# Patient Record
Sex: Female | Born: 1980 | Race: Black or African American | Hispanic: No | Marital: Single | Smoking: Never smoker
Health system: Southern US, Community
[De-identification: ages and names within clinical notes are randomized; demographics above are authoritative.]

## PROBLEM LIST (undated history)

## (undated) ENCOUNTER — Emergency Department (HOSPITAL_COMMUNITY): Disposition: A | Discharge status: Home / Self Care | Payer: Self-pay

## (undated) DIAGNOSIS — N912 Amenorrhea, unspecified: Secondary | ICD-10-CM

## (undated) DIAGNOSIS — D509 Iron deficiency anemia, unspecified: Secondary | ICD-10-CM

## (undated) DIAGNOSIS — H538 Other visual disturbances: Secondary | ICD-10-CM

## (undated) HISTORY — DX: Amenorrhea, unspecified: N91.2

## (undated) HISTORY — DX: Other visual disturbances: H53.8

---
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
## 2004-10-17 ENCOUNTER — Emergency Department (HOSPITAL_COMMUNITY): Admission: EM | Admit: 2004-10-17 | Discharge: 2004-10-17 | Payer: Self-pay | Admitting: Family Medicine

## 2004-10-25 ENCOUNTER — Emergency Department (HOSPITAL_COMMUNITY): Admission: EM | Admit: 2004-10-25 | Discharge: 2004-10-25 | Payer: Self-pay | Admitting: Family Medicine

## 2005-10-18 ENCOUNTER — Emergency Department (HOSPITAL_COMMUNITY): Admission: EM | Admit: 2005-10-18 | Discharge: 2005-10-18 | Payer: Self-pay | Admitting: Family Medicine

## 2006-03-10 ENCOUNTER — Encounter (INDEPENDENT_AMBULATORY_CARE_PROVIDER_SITE_OTHER): Payer: Self-pay | Admitting: *Deleted

## 2006-03-12 ENCOUNTER — Ambulatory Visit: Payer: Self-pay

## 2006-03-26 ENCOUNTER — Emergency Department (HOSPITAL_COMMUNITY): Admission: EM | Admit: 2006-03-26 | Discharge: 2006-03-26 | Payer: Self-pay | Admitting: Family Medicine

## 2006-04-06 ENCOUNTER — Other Ambulatory Visit: Admission: RE | Admit: 2006-04-06 | Discharge: 2006-04-06 | Payer: Self-pay | Admitting: Family Medicine

## 2006-04-06 ENCOUNTER — Ambulatory Visit: Payer: Self-pay | Admitting: Family Medicine

## 2006-06-07 DIAGNOSIS — D509 Iron deficiency anemia, unspecified: Secondary | ICD-10-CM

## 2006-06-07 DIAGNOSIS — F17200 Nicotine dependence, unspecified, uncomplicated: Secondary | ICD-10-CM

## 2006-06-07 HISTORY — DX: Iron deficiency anemia, unspecified: D50.9

## 2006-06-08 ENCOUNTER — Encounter (INDEPENDENT_AMBULATORY_CARE_PROVIDER_SITE_OTHER): Payer: Self-pay | Admitting: *Deleted

## 2007-02-19 ENCOUNTER — Emergency Department (HOSPITAL_COMMUNITY): Admission: EM | Admit: 2007-02-19 | Discharge: 2007-02-19 | Payer: Self-pay | Admitting: Emergency Medicine

## 2007-10-29 ENCOUNTER — Emergency Department (HOSPITAL_COMMUNITY): Admission: EM | Admit: 2007-10-29 | Discharge: 2007-10-29 | Payer: Self-pay | Admitting: Emergency Medicine

## 2007-12-05 ENCOUNTER — Ambulatory Visit: Payer: Self-pay | Admitting: Family Medicine

## 2007-12-24 ENCOUNTER — Encounter: Payer: Self-pay | Admitting: Family Medicine

## 2007-12-25 ENCOUNTER — Telehealth: Payer: Self-pay | Admitting: Family Medicine

## 2007-12-27 ENCOUNTER — Encounter: Payer: Self-pay | Admitting: Family Medicine

## 2007-12-27 ENCOUNTER — Ambulatory Visit: Payer: Self-pay | Admitting: Family Medicine

## 2007-12-27 LAB — CONVERTED CEMR LAB
HDL: 55 mg/dL (ref >39)
LDL Cholesterol: 69 mg/dL (ref 0–99)
TSH: 0.639 microintl units/mL (ref 0.350–4.50)
VLDL: 12 mg/dL (ref 0–40)

## 2008-01-02 ENCOUNTER — Encounter: Payer: Self-pay | Admitting: Family Medicine

## 2008-01-21 ENCOUNTER — Telehealth: Payer: Self-pay | Admitting: Family Medicine

## 2008-05-25 ENCOUNTER — Emergency Department (HOSPITAL_COMMUNITY): Admission: EM | Admit: 2008-05-25 | Discharge: 2008-05-25 | Payer: Self-pay | Admitting: Family Medicine

## 2008-06-01 ENCOUNTER — Encounter: Payer: Self-pay | Admitting: Family Medicine

## 2008-06-01 ENCOUNTER — Ambulatory Visit: Payer: Self-pay | Admitting: Family Medicine

## 2008-06-01 DIAGNOSIS — F1910 Other psychoactive substance abuse, uncomplicated: Secondary | ICD-10-CM

## 2008-06-01 LAB — CONVERTED CEMR LAB
Hemoglobin: 13.2 g/dL (ref 12.0–15.0)
RDW: 13.8 % (ref 11.5–15.5)
TSH: 0.862 microintl units/mL (ref 0.350–4.50)

## 2008-06-03 ENCOUNTER — Encounter: Payer: Self-pay | Admitting: Family Medicine

## 2008-06-11 ENCOUNTER — Ambulatory Visit: Payer: Self-pay | Admitting: Family Medicine

## 2008-06-11 DIAGNOSIS — F341 Dysthymic disorder: Secondary | ICD-10-CM | POA: Insufficient documentation

## 2009-04-10 ENCOUNTER — Emergency Department (HOSPITAL_COMMUNITY): Admission: EM | Admit: 2009-04-10 | Discharge: 2009-04-10 | Payer: Self-pay | Admitting: Family Medicine

## 2010-03-18 ENCOUNTER — Ambulatory Visit: Payer: Self-pay | Admitting: Family Medicine

## 2010-03-18 ENCOUNTER — Emergency Department (HOSPITAL_COMMUNITY)
Admission: EM | Admit: 2010-03-18 | Discharge: 2010-03-18 | Payer: Self-pay | Source: Home / Self Care | Admitting: Family Medicine

## 2010-04-20 ENCOUNTER — Other Ambulatory Visit
Admission: RE | Admit: 2010-04-20 | Discharge: 2010-04-20 | Payer: Self-pay | Source: Home / Self Care | Admitting: Family Medicine

## 2010-04-20 ENCOUNTER — Ambulatory Visit: Admission: RE | Admit: 2010-04-20 | Discharge: 2010-04-20 | Payer: Self-pay | Source: Home / Self Care

## 2010-04-20 ENCOUNTER — Encounter: Payer: Self-pay | Admitting: Family Medicine

## 2010-04-20 LAB — CONVERTED CEMR LAB: Whiff Test: POSITIVE

## 2010-04-27 ENCOUNTER — Ambulatory Visit: Admission: RE | Admit: 2010-04-27 | Discharge: 2010-04-27 | Payer: Self-pay | Source: Home / Self Care

## 2010-04-27 ENCOUNTER — Encounter: Payer: Self-pay | Admitting: Family Medicine

## 2010-04-27 LAB — CONVERTED CEMR LAB: Beta hcg, urine, semiquantitative: NEGATIVE

## 2010-05-10 NOTE — Assessment & Plan Note (Signed)
Summary: ear infection per pt/eo   At 10:50 AM received a call from Urgent Care stating patient is there to be seen.  she states to them that she had waited in our office an hour and she has headache and earache . it is noted that her scheduled appointment with Dr. Clotilde Dieter was 10:15 AM. patient checked in at 10:23. consulted Dr. Leveda Anna and he advises that patient may stay at urgent care to be seen. Theresia Lo RN  March 18, 2010 11:20 AM    arrived at [facility] , no charge. Theresia Lo RN  March 18, 2010 11:20 AM    Complete Medication List: 1)  Antivert 25 Mg Tabs (Meclizine hcl) .Marland Kitchen.. 1 tab by mouth two times a day as needed diziness 2)  Zoloft 25 Mg Tabs (Sertraline hcl) .... Take 1 25mg  tablet by mouth each morning for 1 week, then increase it to two 47m tablets each morning.  Other Orders: No Charge Patient Arrived (NCPA0) (NCPA0)   Orders Added: 1)  No Charge Patient Arrived (NCPA0) [NCPA0]

## 2010-05-11 ENCOUNTER — Encounter: Payer: Self-pay | Admitting: *Deleted

## 2010-05-12 ENCOUNTER — Encounter: Payer: Self-pay | Admitting: Family Medicine

## 2010-05-12 ENCOUNTER — Ambulatory Visit (INDEPENDENT_AMBULATORY_CARE_PROVIDER_SITE_OTHER): Payer: Medicaid Other | Admitting: Family Medicine

## 2010-05-12 DIAGNOSIS — N760 Acute vaginitis: Secondary | ICD-10-CM

## 2010-05-12 DIAGNOSIS — A5901 Trichomonal vulvovaginitis: Secondary | ICD-10-CM

## 2010-05-12 DIAGNOSIS — R109 Unspecified abdominal pain: Secondary | ICD-10-CM

## 2010-05-12 NOTE — Miscellaneous (Signed)
Summary: IUD insertion & removal  IUD insertion & removal   Imported By: Knox Royalty 05/05/2010 11:54:20  _____________________________________________________________________  External Attachment:    Type:   Image     Comment:   External Document

## 2010-05-12 NOTE — Assessment & Plan Note (Signed)
Summary: IUD insertion   Vital Signs:  Patient profile:   30 year old female Height:      62 inches Weight:      109.2 pounds BMI:     20.05 Temp:     98.3 degrees F oral Pulse rate:   69 / minute BP sitting:   143 / 96  (right arm) Cuff size:   regular  Vitals Entered By: Jimmy Footman, CMA (April 27, 2010 9:45 AM) CC: IUD insertion Is Patient Diabetic? No Pain Assessment Patient in pain? no        Primary Care Provider:  Jamie Brookes MD  CC:  IUD insertion.  History of Present Illness: Pt comes in to get her IUD changed out.  Upreg checked: neg, prior to insertion.   Risks and Benefits explained to the patient. Pt signed consent form.  Pt prepped with betadine swabs x 3 Pt sounded to 5-6 cm.  IUD placed with help of Dr. Jennette Kettle. Strings cut and left a little bit long. Pt to return in 2 weeks to have the strings checked and cut shorter.    Habits & Providers  Alcohol-Tobacco-Diet     Tobacco Status: current  Current Medications (verified): 1)  Metronidazole 500 Mg Tabs (Metronidazole) .... Take 4 Tables All At The Same Time, All At Once. Don't Drink Alcohol With This Med. Your Parter(S) Need Treatment or You Will Re-Infect  Allergies (verified): No Known Drug Allergies  Physical Exam  General:  Well-developed,well-nourished,in no acute distress; alert,appropriate and cooperative throughout examination   Impression & Recommendations:  Problem # 1:  CONTRACEPTIVE MANAGEMENT (ICD-V25.09) Assessment New Pt tolerated the procedure well, she had some pain with insertion, pt was given Toradol shot prior to leaving. She will come back in 2 weeks to have the strings checked and shortened. Can recheck for BV at that time.  Orders: U Preg-[facility] (16109) IUD insert- [facility] [id number]) IUD Supply-[facility] (U9811) Ketorolac-Toradol 15mg  (B1478) IUD removal -[facility] (29562)  Complete Medication List: 1)  Metronidazole 500 Mg Tabs (Metronidazole) .... Take 4 tables all at the same  time, all at once. don't drink alcohol with this med. your parter(s) need treatment or you will re-infect  Patient Instructions: 1)  come back in 2 weeks for a strings check and to get retested.    Medication Administration  Injection # 1:    Medication: Ketorolac-Toradol 15mg     Diagnosis: CONTRACEPTIVE MANAGEMENT (ICD-V25.09)    Route: IM    Site: RUOQ gluteus    Exp Date: 09/09/2011    Lot #: [id number]    Mfr: novaplus    Comments: 60mg     Patient tolerated injection without complications    Given by: Jimmy Footman, CMA (April 27, 2010 4:43 PM)  Orders Added: 1)  U Preg-[facility] [81025] 2)  IUD insert- [facility] [58300] 3)  IUD Supply-[facility] [J7302] 4)  Ketorolac-Toradol 15mg  [J1885] 5)  [facility]UD removal -[facility] [58301]    Laboratory Results   Urine Tests  Date/Time Received: April 27, 2010 9:50 AM  Date/Time Reported: April 27, 2010 9:59 AM     Urine HCG: negative Comments: ...............test performed by......Marland KitchenBonnie A. [place], MLS (ASCP)cm
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
## 2010-05-13 ENCOUNTER — Ambulatory Visit: Payer: Self-pay | Admitting: Family Medicine

## 2010-05-13 ENCOUNTER — Telehealth: Payer: Self-pay | Admitting: *Deleted

## 2010-05-13 ENCOUNTER — Ambulatory Visit: Admit: 2010-05-13 | Payer: Self-pay

## 2010-05-18 NOTE — Progress Notes (Signed)
  Phone Note Outgoing Call   Call placed by: Loralee Pacas CMA,  May 13, 2010 9:20 AM Call placed to: Patient Summary of Call: informed pt of results

## 2010-05-18 NOTE — Assessment & Plan Note (Signed)
Summary: abd pain,df   Vital Signs:  Patient profile:   30 year old female Height:      62 inches Weight:      108 pounds BMI:     19.82 Temp:     98.1 degrees F oral Pulse rate:   72 / minute BP sitting:   123 / 85  (left arm) Cuff size:   regular  Vitals Entered By: Tessie Fass CMA (May 12, 2010 10:24 AM) CC: abdominal pain. Pain Assessment Patient in pain? yes     Location: abdomen Intensity: 7   Primary Care Provider:  Jamie Brookes MD  CC:  abdominal pain.Marland Kitchen  History of Present Illness: Crampy abd pain for the last 1 week. She had an IUD placed about 2 weeks ago and is here to get her strings checked at cut. She has cramping pain that lasts minutes to hours and then will go away. She has tried Tylenol with little help, and Motrin which helped better. She says the pain lasted the first 2 days, then went away for about a week and then came back this last 1 week. She is having some constipation and has not stooled in the last 3 days.   Trichomonas: Pt finished her course of Abx and want to know if you infection has cleared.   Current Medications (verified): 1)  Ibuprofen 800 Mg Tabs (Ibuprofen) .... Take 1 Tab Every 8 Hours For Abdominal Cramping  Allergies (verified): No Known Drug Allergies  Review of Systems        vitals reviewed and pertinent negatives and positives seen in HPI   Physical Exam  General:  Well-developed,well-nourished,in no acute distress; alert,appropriate and cooperative throughout examination Abdomen:  Bowel sounds positive,abdomen soft and non-tender without masses, organomegaly but pt has small umbilical hernia.  Genitalia:  Normal introitus for age, no external lesions, no vaginal discharge, mucosa pink and moist, no vaginal or cervical lesions, no vaginal atrophy, no friaility or hemorrhage, normal uterus size and position, no adnexal masses or tenderness, IUD strings noted in vagina Psych:  slightly anxious.     Impression &  Recommendations:  Problem # 1:  ABDOMINAL PAIN, LOWER (ICD-789.09) Assessment New Pt has some cramping likely cause of the IUD. It comes and goes. No pain today. Plan to treat with Ibuprofen as needed. IUD strings shortened today.   Her updated medication list for this problem includes:    Ibuprofen 800 Mg Tabs (Ibuprofen) .Marland Kitchen... Take 1 tab every 8 hours for abdominal cramping  Orders: [facility]- Est Level  3 (56213)  Problem # 2:  TRICHOMONAL VAGINITIS (ICD-131.01) Assessment: Unchanged Will repeat Wet prep to do test of cure. Pt took entire treatment of Flagyl. Will call the patient with results.   Orders: [facility]- Est Level  3 (08657)  Complete Medication List: 1)  Ibuprofen 800 Mg Tabs (Ibuprofen) .... Take 1 tab every 8 hours for abdominal cramping  [facility]  Patient Instructions: 1)  every thing feels normal. 2)  We shortened your strings today.  3)  You can use Motrin 800 mg for your abdominal pain. 4)  My office will call you with your wet prep results.  Prescriptions: IBUPROFEN 800 MG TABS (IBUPROFEN) take 1 tab every 8 hours for abdominal cramping  #60 x 0   Entered and Authorized by:   Jamie Brookes MD   Signed by:   Jamie Brookes MD on 05/12/2010   Method used:   Electronically to  [facility]  [address] [id number]* (retail)       [address]       [place], [place]  [address]       Ph: [phone]       Fax: [phone]   RxID:   [id number]    Orders Added: 1)  Wet Prep- [facility] [87210] 2)  [facility]- Est Level  3 [62694]    Laboratory Results  Date/Time Received: May 12, 2010 10:52 AM  Date/Time Reported: May 12, 2010 11:40 AM   [employer] Source: vag WBC/hpf: loaded Bacteria/hpf: 3+  Rods Clue cells/hpf: none  Negative whiff Yeast/hpf: few Trichomonas/hpf: none Comments: .baj

## 2010-09-12 ENCOUNTER — Inpatient Hospital Stay (INDEPENDENT_AMBULATORY_CARE_PROVIDER_SITE_OTHER)
Admission: RE | Admit: 2010-09-12 | Discharge: 2010-09-12 | Disposition: A | Discharge status: Home / Self Care | Payer: Medicaid Other | Source: Ambulatory Visit | Attending: Emergency Medicine | Admitting: Emergency Medicine

## 2010-09-12 ENCOUNTER — Emergency Department (HOSPITAL_COMMUNITY)
Admission: EM | Admit: 2010-09-12 | Discharge: 2010-09-12 | Disposition: A | Discharge status: Home / Self Care | Payer: Medicaid Other | Attending: Emergency Medicine | Admitting: Emergency Medicine

## 2010-09-12 ENCOUNTER — Emergency Department (HOSPITAL_COMMUNITY): Payer: Medicaid Other

## 2010-09-12 DIAGNOSIS — J029 Acute pharyngitis, unspecified: Secondary | ICD-10-CM

## 2010-09-12 DIAGNOSIS — J039 Acute tonsillitis, unspecified: Secondary | ICD-10-CM | POA: Insufficient documentation

## 2010-09-12 DIAGNOSIS — F17200 Nicotine dependence, unspecified, uncomplicated: Secondary | ICD-10-CM | POA: Insufficient documentation

## 2010-09-12 DIAGNOSIS — J351 Hypertrophy of tonsils: Secondary | ICD-10-CM

## 2010-09-12 LAB — CBC
HCT: 35.9 % — ABNORMAL LOW (ref 36.0–46.0)
Hemoglobin: 12.9 g/dL (ref 12.0–15.0)
MCH: 34.6 pg — ABNORMAL HIGH (ref 26.0–34.0)
MCV: 96.2 fL (ref 78.0–100.0)
Platelets: 227 10*3/uL (ref 150–400)
RBC: 3.73 MIL/uL — ABNORMAL LOW (ref 3.87–5.11)
WBC: 16.8 10*3/uL — ABNORMAL HIGH (ref 4.0–10.5)

## 2010-09-12 LAB — POCT I-STAT, CHEM 8
BUN: 5 mg/dL — ABNORMAL LOW (ref 6–23)
Calcium, Ion: 1.09 mmol/L — ABNORMAL LOW (ref 1.12–1.32)
Chloride: 99 mEq/L (ref 96–112)
HCT: 42 % (ref 36.0–46.0)
Potassium: 2.9 mEq/L — ABNORMAL LOW (ref 3.5–5.1)
Sodium: 137 mEq/L (ref 135–145)

## 2010-09-12 LAB — DIFFERENTIAL
Lymphocytes Relative: 7 % — ABNORMAL LOW (ref 12–46)
Lymphs Abs: 1.2 10*3/uL (ref 0.7–4.0)
Monocytes Relative: 5 % (ref 3–12)
Neutro Abs: 14.7 10*3/uL — ABNORMAL HIGH (ref 1.7–7.7)
Neutrophils Relative %: 88 % — ABNORMAL HIGH (ref 43–77)

## 2010-09-12 LAB — POCT RAPID STREP A: Streptococcus, Group A Screen (Direct): NEGATIVE

## 2010-09-12 LAB — POCT INFECTIOUS MONO SCREEN: Mono Screen: NEGATIVE

## 2010-09-13 ENCOUNTER — Telehealth: Payer: Self-pay | Admitting: Family Medicine

## 2010-09-13 NOTE — Telephone Encounter (Signed)
Pt is supposed to be seen tomorrow by ENT and needs this asap

## 2010-09-13 NOTE — Telephone Encounter (Signed)
Can you please put a referral in or would you prefer patient to come in and see you?

## 2010-09-13 NOTE — Telephone Encounter (Signed)
Pt seen in ED & was told to see ENT doctor, pt needs referral.

## 2010-09-15 ENCOUNTER — Other Ambulatory Visit: Payer: Self-pay | Admitting: Family Medicine

## 2010-09-15 DIAGNOSIS — J039 Acute tonsillitis, unspecified: Secondary | ICD-10-CM

## 2010-09-15 NOTE — Consult Note (Signed)
  NAMEMarland Kitchen  Julia May, Julia May NO.:  [id number]  MEDICAL RECORD NO.:  [id number]  LOCATION:  [facility]                         FACILITY:  [facility]  PHYSICIAN:  Zola Button T. Lazarus Salines, M.D. DATE OF BIRTH:  1981/02/02  DATE OF CONSULTATION:  09/12/2010 DATE OF DISCHARGE:  09/12/2010                                CONSULTATION   CHIEF COMPLAINT:  Severe sore throat.  HISTORY OF PRESENT ILLNESS:  A 30 year old black female came down rather suddenly with a severe sore throat beginning 2 days ago on the left side.  Pain radiating into the left ear.  Pain with swallowing.  No breathing difficulty.  Slight hoarseness on occasion.  Extra phlegm in the throat that she has difficulty clearing.  No documented fever.  She was seen at urgent care where monospot was negative, strep screen was negative, and given a dose of Decadron.  She was sent over to the [facility] Main Emergency Room on the possibility that she might have a peritonsillar abscess.  She was evaluated in the clinical decision unit and felt to have necrotizing tonsillitis.  A CT scan was ordered to assess for abscess.  She has no past history of tonsillitis or sore throat.  No immune compromise or diabetes.  She is a one-pack per day smoker.  PHYSICAL EXAMINATION:  This is a thin uncomfortable-appearing adult black female.  Mental status is appropriate.  She hears well in conversational speech.  Voice is slightly gargly but basically intact. She is breathing comfortably without stridor or labor.  No cough or choking.  Mild fetor oris.  No "hot potato" voice.  No trismus.  The head is atraumatic and neck supple.  Ears are clear with normal aerated drums.  The anterior nose is clear and non-congested.  Oral cavity is moist with teeth in good repair.  Oropharynx shows a shaggy necrotic exudate on the left tonsil only.  There is bulging of the left tonsil and soft palate with some uvular displacement towards the right and uvular edema.   The soft palate is slightly indurated and minimally tender.  Neck is mildly tender in the left jugulodigastric region without discrete adenopathy.  IMPRESSION:  Necrotizing left tonsillitis.  I do not think she has a true peritonsillar abscess at this point.  PLAN:  She has received intravenous clindamycin in the emergency room. I would like her to go home with oral amoxicillin liquid 1 g t.i.d. for 4 days.  By that time, she should be able to take pills and we will continue with 500 mg t.i.d. for six more days.  We will send her home with some narcotic analgesics.  I emphasized hydration over nutrition. I will check her back in my office in 2 days, sooner as needed.  I discussed this with the patient and her mother.     Gloris Manchester. Lazarus Salines, M.D.     KTW/MEDQ  D:  09/12/2010  T:  09/13/2010  Job:  [id number]  Electronically Signed by Flo Shanks M.D. on 09/15/2010 06:18:07 PM

## 2010-09-15 NOTE — Telephone Encounter (Signed)
I put in the referral. Pt has probably already seen the ENt doctor but please let her know the referral was done today. Thanks.

## 2010-09-17 ENCOUNTER — Inpatient Hospital Stay (INDEPENDENT_AMBULATORY_CARE_PROVIDER_SITE_OTHER)
Admission: RE | Admit: 2010-09-17 | Discharge: 2010-09-17 | Disposition: A | Discharge status: Home / Self Care | Payer: Medicaid Other | Source: Ambulatory Visit | Attending: Emergency Medicine | Admitting: Emergency Medicine

## 2010-09-17 DIAGNOSIS — J029 Acute pharyngitis, unspecified: Secondary | ICD-10-CM

## 2011-01-30 ENCOUNTER — Ambulatory Visit (INDEPENDENT_AMBULATORY_CARE_PROVIDER_SITE_OTHER): Payer: Medicaid Other | Admitting: Family Medicine

## 2011-01-30 DIAGNOSIS — R5383 Other fatigue: Secondary | ICD-10-CM

## 2011-01-30 DIAGNOSIS — F341 Dysthymic disorder: Secondary | ICD-10-CM

## 2011-01-30 DIAGNOSIS — F1910 Other psychoactive substance abuse, uncomplicated: Secondary | ICD-10-CM

## 2011-01-30 DIAGNOSIS — D649 Anemia, unspecified: Secondary | ICD-10-CM

## 2011-01-30 LAB — CBC
HCT: 36.8 % (ref 36.0–46.0)
Hemoglobin: 12.5 g/dL (ref 12.0–15.0)
MCHC: 34 g/dL (ref 30.0–36.0)
MCV: 102.2 fL — ABNORMAL HIGH (ref 78.0–100.0)
RDW: 14.4 % (ref 11.5–15.5)

## 2011-01-30 LAB — COMPREHENSIVE METABOLIC PANEL
Albumin: 3.8 g/dL (ref 3.5–5.2)
Alkaline Phosphatase: 45 U/L (ref 39–117)
BUN: 11 mg/dL (ref 6–23)
CO2: 24 mEq/L (ref 19–32)
Glucose, Bld: 85 mg/dL (ref 70–99)
Potassium: 3.8 mEq/L (ref 3.5–5.3)
Sodium: 139 mEq/L (ref 135–145)
Total Bilirubin: 0.4 mg/dL (ref 0.3–1.2)
Total Protein: 6.6 g/dL (ref 6.0–8.3)

## 2011-01-30 MED ORDER — LEVONORGESTREL 20 MCG/24HR IU IUD: 1.0000 | INTRAUTERINE_SYSTEM | Freq: Once | INTRAUTERINE | Status: DC

## 2011-01-30 MED ORDER — TRAZODONE HCL 50 MG PO TABS: 50.0000 mg | ORAL_TABLET | Freq: Every day | ORAL | Status: DC

## 2011-01-30 MED ORDER — CITALOPRAM HYDROBROMIDE 20 MG PO TABS: 20.0000 mg | ORAL_TABLET | Freq: Every day | ORAL | Status: DC

## 2011-01-30 NOTE — Assessment & Plan Note (Signed)
We briefly discussed alcohol use as a common coping mechanism for anxiety and depression. She is somewhat agreeable to addressing this as a component of her total mental health treatment. I have given her information for Alcoholics Anonymous

## 2011-01-30 NOTE — Progress Notes (Signed)
  Subjective:    Patient ID: Julia May, female    DOB: 19-Dec-1980, 30 y.o.   MRN: [id number]  HPI Patient is a 30 year old female who presents for a work in visit for one month history of neck rash. Upon entering the room she states that her primary reason for the visit was to discuss depression and anxiety.  Patient reports a history of depression although this has been largely untreated. She has never had any hospitalizations or visits with mental health. She saw her primary care physician who placed her on an SSRI but she states she discontinued this within a week due to GI side effects and did not follow-up.  In the past 1-2 weeks she reports increasing difficulty in functioning. She reports significant insomnia due to multiple worries at night.  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  Irritability, fatigue, insomnia, and worry are her main symptoms.  She reports no recent history of SI or HI. She does report as 19 contemplating using pills overdose.  She has struggled with substance abuse and most recently notes an increase in her alcohol consumption. She states she drinks a tall bottle of liquor daily.  I have reviewed patient's  PMH, FH, and Social history and Medications as related to this visit.  Review of Systems Please see history of present illness    Objective:   Physical Exam  Psychiatric: Her speech is normal. Her affect is not angry and not labile. She is slowed. Cognition and memory are normal. She exhibits a depressed mood. She expresses no homicidal and no suicidal ideation. She expresses no suicidal plans and no homicidal plans.       Fair to poor insight into alcohol use.    GEN: tearful.  Poor eye contact.  GAD 7: Score of 11 she indicated this makes her life somewhat difficult. PHQ-9: Score of 15 she indicated this makes her life somewhat difficult Mood  disorder questionnaire: Score of 6 indicating this is a serious problem in her life. She does indicate that many of these symptoms occur during the same period of time although upon further questioning does not appear to be related to manic behavior for example in episode when she had more energy than usual she described it as a day she actually got dressed and went out of the house. BSDS: she checked 9 passages and states the story fits knee fairly well with a total score of 13     Assessment & Plan:

## 2011-01-30 NOTE — Assessment & Plan Note (Addendum)
Patient's symptoms seem to be related to anxiety and depression with substance abuse. Bipolar disorder is less likely. I will check CBC, TSH, complete metabolic panel to rule out  other causes of fatigue.  Will start her on Celexa with trazodone as needed at night for insomnia  I have strongly encouraged her to consider therapy as well as joining an Alcoholics Anonymous meeting  She will schedule an appointment in 2-3 weeks with her primary care physician for followup

## 2011-01-30 NOTE — Patient Instructions (Addendum)
You are strong to ask for help!  Will start an antidepressant called citalopram.  Start with one tablet daily.  Will move up to two tablets daily at next visit.  Use trazodone for sleep.  Please contact Dr. Pascal Lux for therapy.  I strongly encourage you to contact Alcoholics Anonymous as an important part of your treatment. [phone] or [email or web address] to find out about meetings  Make a follow-up with your primary doctor for 2-3 weeks.  If you ever feel like you may hurt yourself or others, please call 911 or go to the ER>

## 2011-01-31 ENCOUNTER — Telehealth: Payer: Self-pay | Admitting: Psychology

## 2011-01-31 NOTE — Telephone Encounter (Signed)
Patient left VM.  Reviewed chart with note from Dr. Earnest Bailey.  Given patient's significant alcohol use, [facility] might be the better option to treat mood issues as well as substance abuse.  Called patient to discuss.  She agreed that the alcohol use was a big part of the picture and accepted the information for [facility].  Encouraged her to call me to schedule if she could not get an appointment or if it was not a good match.  [facility] will likely make the alcohol use primary which may be tough for the patient (and yet likely accurate).  Will alert both Dr. Earnest Bailey and PCP.

## 2011-02-01 ENCOUNTER — Encounter: Payer: Self-pay | Admitting: Family Medicine

## 2011-02-01 ENCOUNTER — Telehealth: Payer: Self-pay | Admitting: Family Medicine

## 2011-02-01 NOTE — Telephone Encounter (Signed)
checking in with patient. No answer, left message.

## 2011-02-01 NOTE — Telephone Encounter (Signed)
Patient called back.  She is doing well and has contacted the [facility] and is awaiting a call back.  I advised taking a multivitamin for no anemia but mild macrocytosis.

## 2011-02-03 ENCOUNTER — Telehealth: Payer: Self-pay | Admitting: *Deleted

## 2011-02-03 NOTE — Telephone Encounter (Signed)
Pt is stating that the medication that she was given on 10/22 is making her feel "out of it" she was wanting to know if she can get a work note to excuse her until 02/06/2011. Told pt that there was not a guarantee that she would do this. Forwarded to Dr. Earnest Bailey since she saw pt last.Ervie Mccard, Viann Shove

## 2011-02-03 NOTE — Telephone Encounter (Signed)
If patient does not feel well enough to go to work due to medications, she should return to be evaluated.  If symptoms are mild, then may continue to take them until seen in follow-up.

## 2011-02-03 NOTE — Telephone Encounter (Signed)
Called and informed pt that she will need to come back in to be evaluated. Pt understood and agreed. appt made for 10.30.2012 @ 830 am with Dr. Rivka Safer.Loralee Pacas [place]

## 2011-02-07 ENCOUNTER — Ambulatory Visit: Payer: Medicaid Other | Admitting: Family Medicine

## 2011-02-08 ENCOUNTER — Encounter: Payer: Self-pay | Admitting: Family Medicine

## 2011-02-08 ENCOUNTER — Ambulatory Visit (INDEPENDENT_AMBULATORY_CARE_PROVIDER_SITE_OTHER): Payer: Medicaid Other | Admitting: Family Medicine

## 2011-02-08 DIAGNOSIS — F1020 Alcohol dependence, uncomplicated: Secondary | ICD-10-CM

## 2011-02-08 DIAGNOSIS — IMO0001 Reserved for inherently not codable concepts without codable children: Secondary | ICD-10-CM

## 2011-02-08 DIAGNOSIS — F17200 Nicotine dependence, unspecified, uncomplicated: Secondary | ICD-10-CM

## 2011-02-08 NOTE — Assessment & Plan Note (Signed)
No desire to quit at this time.

## 2011-02-08 NOTE — Progress Notes (Signed)
  Subjective:    Patient ID: Julia May, female    DOB: 1981/02/28, 30 y.o.   MRN: [id number]  HPI 1. Alcohol abuse/ alcoholism Patient drinks a large bottle of vodka daily. She sees this as a problem. She wants to stop drinking, but is scared and in the grips of addiction. She knows about withdrawal seizures/risks. She has an extensive family history of NOS mental disease and substance abuse. She has a daughter who she cares for with support from mother and sister. She also abuses cocaine and marijuana. She describes herself as restless/irritable/discontnent. She feels her drinking has become unmanageable. She drinks alone. CAGE questionnaire: 3/4  2. Medication management Wants to stop Celexa because of dizziness and manic symptoms.    Review of Systems See HPI  No suicidality/homicidality    Objective:   Physical Exam Filed Vitals:   02/08/11 1001  BP: 148/100  Pulse: 88  Psych:  Cognition and judgment appear intact. Alert, communicative  and cooperative with normal attention span and concentration. No apparent delusions, illusions, hallucinations     Assessment & Plan:

## 2011-02-08 NOTE — Patient Instructions (Signed)
I will see you back in one week. Please call if you need to be seen sooner.   Alcohol Withdrawal Anytime drug use is interfering with normal living activities it has become abuse. This includes problems with family and friends. Psychological dependence has developed when your mind tells you that the drug is needed. This is usually followed by physical dependence when a continuing increase of drugs are required to get the same feeling or "high." This is known as addiction or chemical dependency. A person's risk is much higher if there is a history of chemical dependency in the family. Mild Withdrawal Following Stopping Alcohol, When Addiction or Chemical Dependency Has Developed When a person has developed tolerance to alcohol, any sudden stopping of alcohol can cause uncomfortable physical symptoms. Most of the time these are mild and consist of tremors in the hands and increases in heart rate, breathing, and temperature. Sometimes these symptoms are associated with anxiety, panic attacks, and bad dreams. There may also be stomach upset. Normal sleep patterns are often interrupted with periods of inability to sleep (insomnia). This may last for 6 months. Because of this discomfort, many people choose to continue drinking to get rid of this discomfort and to try to feel normal. Severe Withdrawal with Decreased or No Alcohol Intake, When Addiction or Chemical Dependency Has Developed About five percent of alcoholics will develop signs of severe withdrawal when they stop using alcohol. One sign of this is development of generalized seizures (convulsions). Other signs of this are severe agitation and confusion. This may be associated with believing in things which are not real or seeing things which are not really there (delusions and hallucinations). Vitamin deficiencies are usually present if alcohol intake has been long-term. Treatment for this most often requires hospitalization and close  observation. Addiction can only be helped by stopping use of all chemicals. This is hard but may save your life. With continual alcohol use, possible outcomes are usually loss of self respect and esteem, violence, and death. Addiction cannot be cured but it can be stopped. This often requires outside help and the care of professionals. Treatment centers are listed in the yellow pages under Cocaine, Narcotics, and Alcoholics Anonymous. Most hospitals and clinics can refer you to a specialized care center. It is not necessary for you to go through the uncomfortable symptoms of withdrawal. Your caregiver can provide you with medicines that will help you through this difficult period. Try to avoid situations, friends, or drugs that made it possible for you to keep using alcohol in the past. Learn how to say no. It takes a long period of time to overcome addictions to all drugs, including alcohol. There may be many times when you feel as though you want a drink. After getting rid of the physical addiction and withdrawal, you will have a lessening of the craving which tells you that you need alcohol to feel normal. Call your caregiver if more support is needed. Learn who to talk to in your family and among your friends so that during these periods you can receive outside help. Alcoholics Anonymous (AA) has helped many people over the years. To get further help, contact AA or call your caregiver, counselor, or clergyperson. Al-Anon and Alateen are support groups for friends and family members of an alcoholic. The people who love and care for an alcoholic often need help, too. For information about these organizations, check your phone directory or call a local alcoholism treatment center.   SEEK IMMEDIATE MEDICAL  CARE IF:    You have a seizure.     You have a fever.     You experience uncontrolled vomiting or you vomit up blood. This may be bright red or look like black coffee grounds.     You have blood in  the stool. This may be bright red or appear as a black, tarry, bad-smelling stool.     You become lightheaded or faint. Do not drive if you feel this way. Have someone else drive you or call 161 for help.     You become more agitated or confused.     You develop uncontrolled anxiety.     You begin to see things that are not really there (hallucinate).  Your caregiver has determined that you completely understand your medical condition, and that your mental state is back to normal. You understand that you have been treated for alcohol withdrawal, have agreed not to drink any alcohol for a minimum of 1 day, will not operate a car or other machinery for 24 hours, and have had an opportunity to ask any questions about your condition. Document Released: 01/04/2005 Document Revised: 12/07/2010 Document Reviewed: 11/13/2007 [facility] Patient Information 2012 [place], [place].

## 2011-02-08 NOTE — Assessment & Plan Note (Signed)
Patient has longstanding history of every day use of large amounts of alcohol. Advised her not to quit cold [place]. I counseled her to go to a detox. Center or inpatient treatment. She has a daughter and does not want to leave her. I will see her back in one week. Plan will be one week detox. [facility] and [facility] meetings.

## 2011-02-16 ENCOUNTER — Ambulatory Visit (INDEPENDENT_AMBULATORY_CARE_PROVIDER_SITE_OTHER): Payer: Medicaid Other | Admitting: Family Medicine

## 2011-02-16 ENCOUNTER — Encounter: Payer: Self-pay | Admitting: Family Medicine

## 2011-02-16 DIAGNOSIS — F1020 Alcohol dependence, uncomplicated: Secondary | ICD-10-CM

## 2011-02-16 NOTE — Assessment & Plan Note (Signed)
Patient has been sober now for 8 days. She is attending AA meetings. She does not have a sponsor. She is going to alcohol abuse groups at the [facility]. She has not had withdrawal seizures/DT's. Patient advised to keep going to AA. F/u in one week.

## 2011-02-16 NOTE — Progress Notes (Signed)
  Subjective:    Patient ID: Julia May, female    DOB: 11/30/1980, 30 y.o.   MRN: [id number]  HPI 1. Alcohol Abuse/Dependance Sober for 8 days. Attending AA meetings and [facility] for counseling/groups. She does have cravings multiple times per day. She is doing well at work. Her family is supportive. She is not using other drugs. She has no suicidal or homicidal thoughts. Her mood is fluctuating on a daily basis, but is markedly improved from our last meeting on the 31st.   Review of Systems No fever, weight loss.    Objective:   Physical Exam  Psychiatric: She has a normal mood and affect. Her speech is normal and behavior is normal. Judgment and thought content normal.   Filed Vitals:   02/16/11 0830  BP: 114/78  Pulse: 78  Temp: 97.7 F (36.5 C)  TempSrc: Oral  Height: 5\' 2"  (1.575 m)  Weight: 110 lb (49.896 kg)      Assessment & Plan:

## 2011-02-16 NOTE — Patient Instructions (Signed)
It was great to see you today!  Schedule an appointment to see me in one week.  Great work on Scientist, water quality.  Keep working on living one day at a time and call people if you have any cravings or worries.

## 2011-03-09 ENCOUNTER — Ambulatory Visit (INDEPENDENT_AMBULATORY_CARE_PROVIDER_SITE_OTHER): Payer: Medicaid Other | Admitting: Family Medicine

## 2011-03-09 ENCOUNTER — Encounter: Payer: Self-pay | Admitting: Family Medicine

## 2011-03-09 DIAGNOSIS — F1020 Alcohol dependence, uncomplicated: Secondary | ICD-10-CM

## 2011-03-09 NOTE — Patient Instructions (Signed)
It was great to see you today!  Schedule an appointment to see me in 1 week.  Great work on your sobriety.  Work on the homework I gave you for next week.
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
## 2011-03-09 NOTE — Assessment & Plan Note (Signed)
38 days clean. Seeing a counselor tues.  She will continue to see me once a week.  I gave her homework to work out: 1. List of character defects 2. 10 resentments

## 2011-03-13 ENCOUNTER — Ambulatory Visit: Payer: Medicaid Other | Admitting: Family Medicine

## 2011-05-11 ENCOUNTER — Ambulatory Visit: Payer: Medicaid Other | Admitting: Family Medicine

## 2012-02-16 ENCOUNTER — Ambulatory Visit (INDEPENDENT_AMBULATORY_CARE_PROVIDER_SITE_OTHER): Payer: Medicaid Other | Admitting: Sports Medicine

## 2012-02-16 ENCOUNTER — Encounter: Payer: Self-pay | Admitting: Sports Medicine

## 2012-02-16 ENCOUNTER — Encounter: Payer: Self-pay | Admitting: *Deleted

## 2012-02-16 VITALS — BP 130/90 | HR 71 | Temp 98.8°F | Ht 62.0 in | Wt 119.9 lb

## 2012-02-16 DIAGNOSIS — J069 Acute upper respiratory infection, unspecified: Secondary | ICD-10-CM | POA: Insufficient documentation

## 2012-02-16 DIAGNOSIS — F17200 Nicotine dependence, unspecified, uncomplicated: Secondary | ICD-10-CM

## 2012-02-16 DIAGNOSIS — L298 Other pruritus: Secondary | ICD-10-CM

## 2012-02-16 MED ORDER — ALBUTEROL SULFATE HFA 108 (90 BASE) MCG/ACT IN AERS: 2.0000 | INHALATION_SPRAY | Freq: Four times a day (QID) | RESPIRATORY_TRACT | Status: DC | PRN

## 2012-02-16 NOTE — Assessment & Plan Note (Addendum)
Contemplative Informed her of wheezes on exam likely associated with smoking hx RTC if interested in discussing further. Consider PTFs when improved

## 2012-02-16 NOTE — Assessment & Plan Note (Addendum)
Pt with post viral cough worsened by smoking history.   Rx albuterol and reviewed likely prolonged course. Reviewed red flags for follow up.

## 2012-02-16 NOTE — Progress Notes (Signed)
  [facility]  Patient name: Julia May MRN [id number]  Date of birth: December 06, 1980  CC & HPI:  Julia May is a 31 y.o. female presenting today for evaluation of cough and rash.  # Rash developed over the last 3 days, small patches of dry skin that itch,  Uses lotion and improves but will show up in new area.  Changed soaps to dial from [place].  # Cough.  Cold like symptoms 7 days ago with fevers and chills and congestion X 3 days.  Improved now but still having cough.  Reports some chest pressure,  Non-productive cough.  Continues to smoke >1ppd.  No hemoptysis.  # Tobacco dependence - continues to smoke >1ppd.  Interested in quitting.  Still smoking despite 1 week of cough   ROS:  No current fevers, chills, nausea vomiting.  Pertinent History Reviewed:  Medical & Surgical Hx:  Reviewed: Significant for EtOH and Tobacco abuse Medications: Reviewed & Updated - see associated section Social History: Reviewed -  reports that she has been smoking Cigarettes.  She has a 3.5 pack-year smoking history. She does not have any smokeless tobacco history on file.   Objective Findings:  Vitals:  BP 130/90  Pulse 71  Temp 98.8 F (37.1 C) (Oral)  Ht 5\' 2"  (1.575 m)  Wt 119 lb 14.4 oz (54.386 kg)  BMI 21.93 kg/m2   PE: GENERAL:  Adult AA female. In no discomfort; no respiratory distress. PSYCH: Alert and appropriately interactive; Insight:Good   H&N: AT/[place], trachea midline EENT:  MMM, no scleral icterus, EOMi HEART: RRR, S1/S2 heard, no murmur LUNGS: Diffuse inspiratory and expiratory wheezes. SKIN:  Dry skin with eczematous patchy areas on chest and back with secondary excoriations.  No burrows, or wheals.   Assessment & Plan:

## 2012-02-16 NOTE — Assessment & Plan Note (Signed)
Pt changed to dial soap Return to using DOVE Use Eucerin cream

## 2012-02-16 NOTE — Patient Instructions (Addendum)
It was nice to meet you today.  I have sent in a prescription for an albuterol inhaler to help with your wheezing and chest tightness.  You can use this up to every 4 hours only as needed.  Please use DOVE soap for your dry skin and Eucerin Cream daily on your body.  For the rashes you can use over the counter Hydrocortisone cream 1% once daily on the dry itchy areas until they improve.  Return in 3-4 if not improved or if you start to develop a fever. QUIT SMOKING. Check out [id number] and return to see [place] in clinic if you are interested in discussing quitting.

## 2012-08-30 ENCOUNTER — Encounter (INDEPENDENT_AMBULATORY_CARE_PROVIDER_SITE_OTHER): Payer: Medicaid Other | Admitting: Family Medicine

## 2012-09-04 ENCOUNTER — Encounter: Payer: Medicaid Other | Admitting: Family Medicine

## 2012-09-19 ENCOUNTER — Other Ambulatory Visit (HOSPITAL_COMMUNITY)
Admission: RE | Admit: 2012-09-19 | Discharge: 2012-09-19 | Disposition: A | Discharge status: Home / Self Care | Payer: Medicaid Other | Source: Ambulatory Visit | Attending: Family Medicine | Admitting: Family Medicine

## 2012-09-19 ENCOUNTER — Encounter: Payer: Self-pay | Admitting: Family Medicine

## 2012-09-19 ENCOUNTER — Telehealth: Payer: Self-pay | Admitting: Family Medicine

## 2012-09-19 ENCOUNTER — Ambulatory Visit (INDEPENDENT_AMBULATORY_CARE_PROVIDER_SITE_OTHER): Payer: Medicaid Other | Admitting: Family Medicine

## 2012-09-19 VITALS — BP 135/77 | HR 77 | Temp 98.5°F | Ht 62.0 in | Wt 115.0 lb

## 2012-09-19 DIAGNOSIS — Z1151 Encounter for screening for human papillomavirus (HPV): Secondary | ICD-10-CM | POA: Insufficient documentation

## 2012-09-19 DIAGNOSIS — Z20828 Contact with and (suspected) exposure to other viral communicable diseases: Secondary | ICD-10-CM

## 2012-09-19 DIAGNOSIS — Z113 Encounter for screening for infections with a predominantly sexual mode of transmission: Secondary | ICD-10-CM | POA: Insufficient documentation

## 2012-09-19 DIAGNOSIS — Z202 Contact with and (suspected) exposure to infections with a predominantly sexual mode of transmission: Secondary | ICD-10-CM

## 2012-09-19 DIAGNOSIS — Z01419 Encounter for gynecological examination (general) (routine) without abnormal findings: Secondary | ICD-10-CM | POA: Insufficient documentation

## 2012-09-19 DIAGNOSIS — R109 Unspecified abdominal pain: Secondary | ICD-10-CM

## 2012-09-19 DIAGNOSIS — Z124 Encounter for screening for malignant neoplasm of cervix: Secondary | ICD-10-CM

## 2012-09-19 LAB — POCT WET PREP (WET MOUNT): WBC, Wet Prep HPF POC: >20

## 2012-09-19 MED ORDER — METRONIDAZOLE 500 MG PO TABS: 2000.0000 mg | ORAL_TABLET | Freq: Once | ORAL | Status: DC

## 2012-09-19 NOTE — Progress Notes (Signed)
  Subjective:    Patient ID: Blenda Mounts, female    DOB: 1980-10-06, 32 y.o.   MRN: [id number]  HPI # SDA lower abdominal cramping for the past 2 weeks Description: it feels like somebody is inside her belly and grabbing her belly; like a knife is moving around; it is very sharp and then her stomach hard; intermittent; it is not as bad today Sexually active? 1 partner; did not use condoms No history of STI as far as she knows.   Review of Systems Denies fevers, chills, nausea, vomiting, diarrhea, constipation, dysuria/frequency/urgency  Endorses vaginal irritation and white creamy discharge Denies recent antibiotics   Allergies, medication, past medical history reviewed.  Smoking status noted. Tobacco 1 ppd Cocaine; last 1 week ago Alcohol: every day; no eye opener; last drunk yesterday Marijuana: routinely     Objective:   Physical Exam GEN: NAD CV: RRR PULM: NI WOB; CTAB without w/r/r ABD: NABS, soft, mild-moderate tenderness suprapubic area GU: vaginal wall non tender, non-erythematous, thick white discharge, no cervical motion tenderness    Assessment & Plan:

## 2012-09-19 NOTE — Telephone Encounter (Signed)
Patient notified positive Trich.  Rx sent for her and her partner.  She would prefer one time big dose. Advised not to drink alcohol on medication and to avoid intercourse for next several days.

## 2012-09-19 NOTE — Assessment & Plan Note (Signed)
Suprapubic pain.  Concern for STI.  -Check wet prep, GC/Chlamydia -Pap; last 04/2010 normal for malignancy, positive for Trich -HIV, RPR

## 2012-09-19 NOTE — Patient Instructions (Addendum)
I will call you with lab results.   If you have fevers/chills/nausea, call and let me know.

## 2012-09-20 LAB — RPR

## 2012-09-24 ENCOUNTER — Encounter: Payer: Self-pay | Admitting: Family Medicine

## 2012-11-08 ENCOUNTER — Ambulatory Visit (INDEPENDENT_AMBULATORY_CARE_PROVIDER_SITE_OTHER): Payer: Medicaid Other | Admitting: Family Medicine

## 2012-11-08 ENCOUNTER — Encounter: Payer: Self-pay | Admitting: Family Medicine

## 2012-11-08 VITALS — BP 132/91 | HR 81 | Temp 99.0°F | Ht 62.0 in | Wt 113.2 lb

## 2012-11-08 DIAGNOSIS — H60399 Other infective otitis externa, unspecified ear: Secondary | ICD-10-CM

## 2012-11-08 DIAGNOSIS — H60501 Unspecified acute noninfective otitis externa, right ear: Secondary | ICD-10-CM

## 2012-11-08 DIAGNOSIS — H6120 Impacted cerumen, unspecified ear: Secondary | ICD-10-CM | POA: Insufficient documentation

## 2012-11-08 MED ORDER — ALBUTEROL SULFATE HFA 108 (90 BASE) MCG/ACT IN AERS: 2.0000 | INHALATION_SPRAY | Freq: Four times a day (QID) | RESPIRATORY_TRACT | Status: DC | PRN

## 2012-11-08 MED ORDER — IBUPROFEN 600 MG PO TABS: 600.0000 mg | ORAL_TABLET | Freq: Three times a day (TID) | ORAL | Status: DC | PRN

## 2012-11-08 MED ORDER — NEOMYCIN-POLYMYXIN-HC 3.5-10000-1 OT SOLN: 3.0000 [drp] | Freq: Four times a day (QID) | OTIC | Status: AC

## 2012-11-08 MED ORDER — ANTIPYRINE-BENZOCAINE 5.4-1.4 % OT SOLN: 3.0000 [drp] | Freq: Four times a day (QID) | OTIC | Status: DC | PRN

## 2012-11-08 NOTE — Patient Instructions (Addendum)
Julia May,  Thank you for coming in today.  I prescribed around him and ibuprofen 3 to take for pain. Please take the ibuprofen up to 3 times daily with food. Please place Cortisporin ear drops in her right ear every 4 hours for the next 7 days.  F/u next week.  Followup sooner for worsening pain or fever.  Dr. Armen Pickup

## 2012-11-08 NOTE — Assessment & Plan Note (Signed)
Assessment:    Right otitis externa    Plan:    Treatment: Cortisporin. OTC analgesia as needed and auralgan. Water exclusion from affected ear until symptoms resolve. Follow up in 7 days if sy

## 2012-11-08 NOTE — Progress Notes (Signed)
Patient ID: Julia May, female   DOB: May 13, 1980, 32 y.o.   MRN: [id number] Subjective:     Julia May is a 32 y.o. female who presents for evaluation of right ear pain. Symptoms have been present for 7 days. She also notes decreased hearing in the right ear, severe pain in the right ear and a plugged sensation in the right ear. She does have a history of ear infections. She does not have a history of recent swimming. She denies fevers. The patient's history has been marked as reviewed and updated as appropriate.   Review of Systems Pertinent items are noted in HPI.   Objective:    BP 132/91  Pulse 81  Temp(Src) 99 F (37.2 C) (Oral)  Ht 5\' 2"  (1.575 m)  Wt 113 lb 3.2 oz (51.347 kg)  BMI 20.7 kg/m2 General:  alert, cooperative and no distress  Right Ear: There is crusting in the right external auditory canal. This tended to palpation of the pinna. There is nontender to palpation of the tightness. The tympanic membrane is obscured by debris. I was able to remove some of the debris manually but not all due to significant patient discomfort.  Left Ear: left TM Obstructed by wax. After the wax irrigated. I visualized a normal external canal and normal TM.  Mouth:  lips, mucosa, and tongue normal; teeth and gums normal  Neck: no adenopathy, no carotid bruit, no JVD, supple, symmetrical, trachea midline and thyroid not enlarged, symmetric, no tenderness/mass/nodules       Assessment:    Right otitis externa    Plan:    Treatment: Cortisporin. OTC analgesia as needed and auralgan. Water exclusion from affected ear until symptoms resolve. Follow up in 7 days if symptoms not improving.

## 2012-11-19 ENCOUNTER — Encounter: Payer: Self-pay | Admitting: Family Medicine

## 2012-11-19 ENCOUNTER — Ambulatory Visit (INDEPENDENT_AMBULATORY_CARE_PROVIDER_SITE_OTHER): Payer: Medicaid Other | Admitting: Family Medicine

## 2012-11-19 VITALS — BP 122/82 | HR 76 | Temp 98.5°F | Ht 62.4 in | Wt 112.9 lb

## 2012-11-19 DIAGNOSIS — H60399 Other infective otitis externa, unspecified ear: Secondary | ICD-10-CM

## 2012-11-19 NOTE — Progress Notes (Deleted)
Subjective:     Patient ID: Julia May, female   DOB: 1981-03-08, 32 y.o.   MRN: [id number]  HPI   Review of Systems     Objective:   Physical Exam     Assessment and Plan:

## 2012-11-21 NOTE — Assessment & Plan Note (Signed)
F/u rescheduled.

## 2012-11-21 NOTE — Progress Notes (Signed)
I was running behind and patient had to leave. appt rescheduled.

## 2012-11-26 ENCOUNTER — Ambulatory Visit (INDEPENDENT_AMBULATORY_CARE_PROVIDER_SITE_OTHER): Payer: Medicaid Other | Admitting: Family Medicine

## 2012-11-26 ENCOUNTER — Encounter: Payer: Self-pay | Admitting: Family Medicine

## 2012-11-26 VITALS — BP 131/89 | HR 70 | Temp 98.4°F | Wt 111.0 lb

## 2012-11-26 DIAGNOSIS — H60501 Unspecified acute noninfective otitis externa, right ear: Secondary | ICD-10-CM

## 2012-11-26 DIAGNOSIS — H6120 Impacted cerumen, unspecified ear: Secondary | ICD-10-CM

## 2012-11-26 DIAGNOSIS — H6121 Impacted cerumen, right ear: Secondary | ICD-10-CM

## 2012-11-26 DIAGNOSIS — H60399 Other infective otitis externa, unspecified ear: Secondary | ICD-10-CM

## 2012-11-26 MED ORDER — CARBAMIDE PEROXIDE 6.5 % OT SOLN: 5.0000 [drp] | Freq: Two times a day (BID) | OTIC | Status: AC

## 2012-11-26 NOTE — Assessment & Plan Note (Signed)
A: Infection. resolved. Patient was subsequent impaction of her canal with debris. Debris irrigated. P: D. Debrox  to be used every 4-6 weeks x4 days as needed for cerumen impaction.

## 2012-11-26 NOTE — Patient Instructions (Addendum)
Julia May,  Thank you for coming in today. Please stop auralgan.  Start debrox to loosen up ear wax and debris as needed use for 4 days at a time every 4-6 weeks.  Dr. Armen Pickup

## 2012-11-26 NOTE — Progress Notes (Signed)
Subjective:     Patient ID: Julia May, female   DOB: Apr 29, 1980, 31 y.o.   MRN: [id number]  HPI 32 year old female presents for follow visit after completing treatment for acute otitis externa her right ear. She has finished cortisporin drops. She still using AB otic 1-2 times daily. She denies ear pain. She admits to decreased hearing in her right ear as well as some symptoms of vertigo when she uses the eyedrops. She denies fever.  Review of Systems As per HPI     Objective:   Physical Exam BP 131/89  Pulse 70  Temp(Src) 98.4 F (36.9 C) (Oral)  Wt 111 lb (50.349 kg)  BMI 20.04 kg/m2 General appearance: alert, appears stated age and no distress Ears: abnormal external canal right ear - debris in canal and abnormal external canal left ear - debris in canal Debris removed via irrigation. Normal canal and TM post irrigation.     Assessment and Plan:

## 2012-11-27 ENCOUNTER — Telehealth: Payer: Self-pay | Admitting: *Deleted

## 2012-11-27 NOTE — Telephone Encounter (Signed)
Lattie Corns,   Thank you for the feedback. I will take it upon myself to be as timely as possible.  I must charge my nurses  to please keep patient's updated regarding my availability.  I know the tendency is to say "the doctor will be in soon", but this is vague and not always true if I am running behind.  Instead please say the doctor should be with you within 5 minutes. Please check with me if you see I have not been in the room within 5 minutes for an update to give to the patient.   Thank you.   Dr. Armen Pickup   PS I have included all the teams on this because I float often. The visit the patient was referring to on 11/26/12 was on the red hall.

## 2012-11-27 NOTE — Telephone Encounter (Signed)
Patient had office visit with Dr. Armen Pickup on 11/26/12.  Patient completed "Patient Feedback Form" at end of visit.  Complaint/Concern--"I came to my appt on time (early) and was called back in a timely manner.  Then when I got to the patient room I sat for 45 minutes and no one ever seen me or even peeked to check on me, inform me the doctor was late, or anything.  I had to reschedule again.  This is the second time this happened.  We have appt so we can be seen at this time not when convenient for you.  People have other things and appts to attend to as well.  Me coming out of this appt late held my other appt up."   Called patient for additional info.  Note was completed after yesterday's appt, but was based on last week's office visit (11/19/12) with Dr. Armen Pickup that had to be rescheduled for 11/26/12.  Patient states yesterday's visit was "much better" and "Dr. Armen Pickup was in and out."   Will route note to Dr. Armen Pickup and [facility] team.   Gaylene Brooks, RN

## 2012-11-27 NOTE — Telephone Encounter (Signed)
Thanks for the feedback.  Will make sure to inform staff to keep patients updated with the doctor's schedule.  Gaylene Brooks, RN

## 2013-01-02 ENCOUNTER — Emergency Department (HOSPITAL_COMMUNITY)
Admission: EM | Admit: 2013-01-02 | Discharge: 2013-01-02 | Disposition: A | Discharge status: Home / Self Care | Payer: BC Managed Care – PPO | Attending: Emergency Medicine | Admitting: Emergency Medicine

## 2013-01-02 ENCOUNTER — Encounter (HOSPITAL_COMMUNITY): Payer: Self-pay | Admitting: *Deleted

## 2013-01-02 DIAGNOSIS — K089 Disorder of teeth and supporting structures, unspecified: Secondary | ICD-10-CM | POA: Insufficient documentation

## 2013-01-02 DIAGNOSIS — K0889 Other specified disorders of teeth and supporting structures: Secondary | ICD-10-CM

## 2013-01-02 MED ORDER — HYDROCODONE-ACETAMINOPHEN 5-325 MG PO TABS: 1.0000 | ORAL_TABLET | ORAL | Status: DC | PRN

## 2013-01-02 MED ORDER — HYDROCODONE-ACETAMINOPHEN 5-325 MG PO TABS
1.0000 | ORAL_TABLET | Freq: Once | ORAL | Status: AC
  Administered 2013-01-02: 1 via ORAL
  Filled 2013-01-02: qty 1

## 2013-01-02 NOTE — ED Provider Notes (Signed)
CSN: [id number]     Arrival date & time 01/02/13  1605 History  This chart was scribed for non-physician practitioner, Marlon Pel, PA-C working with Junius Argyle, MD by Greggory Stallion, ED scribe. This patient was seen in room TR07C/TR07C and the patient's care was started at 4:46 PM.   Chief Complaint  Patient presents with  . Dental Pain   The history is provided by the patient. No language interpreter was used.    HPI Comments: Christina Wheeler is a 32 y.o. female who presents to the Emergency Department complaining of gradual onset, constant left lower dental pain that started 2 days ago. Pt states she thinks it is an abscessed tooth. She had this problem about 2 months ago and was given clindamycin and ibuprofen. Pt states she refilled the antibiotic she was given last time and started taking it yesterday. She states ibuprofen provides no relief. Pt states she has a dentist appointment in October.   History reviewed. No pertinent past medical history. History reviewed. No pertinent past surgical history. History reviewed. No pertinent family history. History  Substance Use Topics  . Smoking status: Never Smoker   . Smokeless tobacco: Not on file  . Alcohol Use: No   OB History   Grav Para Term Preterm Abortions TAB SAB Ect Mult Living                 Review of Systems  HENT: Positive for dental problem.   All other systems reviewed and are negative.    Allergies  Review of patient's allergies indicates no known allergies.  Home Medications   Current Outpatient Rx  Name  Route  Sig  Dispense  Refill  . HYDROcodone-acetaminophen (NORCO/VICODIN) 5-325 MG per tablet   Oral   Take 1 tablet by mouth every 4 (four) hours as needed for pain.   20 tablet   0     BP 130/80  Pulse 83  Temp(Src) 98.9 F (37.2 C) (Oral)  Resp 18  Ht 5\' 7"  (1.702 m)  Wt 198 lb (89.812 kg)  BMI 31 kg/m2  SpO2 100%  LMP 12/29/2012  Physical Exam  Nursing note and vitals  reviewed. Constitutional: She is oriented to person, place, and time. She appears well-developed and well-nourished. No distress.  HENT:  Head: Normocephalic and atraumatic.  Mouth/Throat: Uvula is midline, oropharynx is clear and moist and mucous membranes are normal. Normal dentition. Dental caries (Pts tooth shows no obvious abscess but moderate to severe tenderness to palpation of marked tooth) present. No edematous.    Eyes: EOM are normal. Pupils are equal, round, and reactive to light.  Neck: Trachea normal, normal range of motion and full passive range of motion without pain. Neck supple. No tracheal deviation present.  Cardiovascular: Normal rate, regular rhythm, normal heart sounds and normal pulses.   Pulmonary/Chest: Effort normal and breath sounds normal. No respiratory distress. She has no wheezes. She has no rales. Chest wall is not dull to percussion. She exhibits no tenderness, no crepitus, no edema, no deformity and no retraction.  Abdominal: Normal appearance.  Musculoskeletal: Normal range of motion.  Neurological: She is alert and oriented to person, place, and time. She has normal strength.  Skin: Skin is warm, dry and intact. She is not diaphoretic.  Psychiatric: She has a normal mood and affect. Her speech is normal and behavior is normal. Cognition and memory are normal.    ED Course  Procedures (including critical care time)  DIAGNOSTIC STUDIES: Oxygen  Saturation is 100% on RA, normal by my interpretation.    COORDINATION OF CARE: 4:48 PM-Discussed treatment plan which includes continuing antibiotic and prescribing a new pain medication with pt at bedside and pt agreed to plan.   Labs Review Labs Reviewed - No data to display Imaging Review No results found.  MDM   1. Toothache    Patient has dental pain. No emergent s/sx's present. Patent airway. No trismus.  Will be given pain medication and antibiotics. I discussed the need to call dentist within  24/48 hours for follow-up. Dental referral given. Return to ED precautions given.  Pt voiced understanding and has agreed to follow-up.   32 y.o.Christina Wheeler's evaluation in the Emergency Department is complete. It has been determined that no acute conditions requiring further emergency intervention are present at this time. The patient/guardian have been advised of the diagnosis and plan. We have discussed signs and symptoms that warrant return to the ED, such as changes or worsening in symptoms.  Vital signs are stable at discharge. Filed Vitals:   01/02/13 1612  BP: 130/80  Pulse: 83  Temp: 98.9 F (37.2 C)  Resp: 18    Patient/guardian has voiced understanding and agreed to follow-up with the PCP or specialist.   I personally performed the services described in this documentation, which was scribed in my presence. The recorded information has been reviewed and is accurate.   Dorthula Matas, PA-C 01/02/13 1707

## 2013-01-02 NOTE — ED Notes (Signed)
Reports left side lower dental pain and abscess x 2 days. Airway intact.

## 2013-01-03 NOTE — ED Provider Notes (Signed)
Medical screening examination/treatment/procedure(s) were performed by non-physician practitioner and as supervising physician I was immediately available for consultation/collaboration.   Junius Argyle, MD 01/03/13 [phone]

## 2013-05-13 ENCOUNTER — Encounter (HOSPITAL_COMMUNITY): Payer: Self-pay | Admitting: Emergency Medicine

## 2013-05-13 ENCOUNTER — Emergency Department (HOSPITAL_COMMUNITY)
Admission: EM | Admit: 2013-05-13 | Discharge: 2013-05-13 | Disposition: A | Discharge status: Home / Self Care | Payer: BC Managed Care – PPO | Source: Home / Self Care | Attending: Emergency Medicine | Admitting: Emergency Medicine

## 2013-05-13 DIAGNOSIS — G44209 Tension-type headache, unspecified, not intractable: Secondary | ICD-10-CM

## 2013-05-13 NOTE — ED Notes (Signed)
Co headache x 1 wk.  Denies any visual changes, n/v. Hx of migraines.  Pt has not tried any otc meds for pain.

## 2013-05-13 NOTE — ED Provider Notes (Signed)
Medical screening examination/treatment/procedure(s) were performed by non-physician practitioner and as supervising physician I was immediately available for consultation/collaboration.  Alick Lecomte, M.D.  Chidubem Chaires C Zakyria Metzinger, MD 05/13/13 2158 

## 2013-05-13 NOTE — Discharge Instructions (Signed)
Take Excedrin Migraine as needed for the headache, according to package instructions. Followup with a primary care physician if the headaches continue.  Tension Headache A tension headache is a feeling of pain, pressure, or aching often felt over the front and sides of the head. The pain can be dull or can feel tight (constricting). It is the most common type of headache. Tension headaches are not normally associated with nausea or vomiting and do not get worse with physical activity. Tension headaches can last 30 minutes to several days.  CAUSES  The exact cause is not known, but it may be caused by chemicals and hormones in the brain that lead to pain. Tension headaches often begin after stress, anxiety, or depression. Other triggers may include:  Alcohol.  Caffeine (too much or withdrawal).  Respiratory infections (colds, flu, sinus infections).  Dental problems or teeth clenching.  Fatigue.  Holding your head and neck in one position too long while using a computer. SYMPTOMS   Pressure around the head.   Dull, aching head pain.   Pain felt over the front and sides of the head.   Tenderness in the muscles of the head, neck, and shoulders. DIAGNOSIS  A tension headache is often diagnosed based on:   Symptoms.   Physical examination.   A CT scan or MRI of your head. These tests may be ordered if symptoms are severe or unusual. TREATMENT  Medicines may be given to help relieve symptoms.  HOME CARE INSTRUCTIONS   Only take over-the-counter or prescription medicines for pain or discomfort as directed by your caregiver.   Lie down in a dark, quiet room when you have a headache.   Keep a journal to find out what may be triggering your headaches. For example, write down:  What you eat and drink.  How much sleep you get.  Any change to your diet or medicines.  Try massage or other relaxation techniques.   Ice packs or heat applied to the head and neck can be  used. Use these 3 to 4 times per day for 15 to 20 minutes each time, or as needed.   Limit stress.   Sit up straight, and do not tense your muscles.   Quit smoking if you smoke.  Limit alcohol use.  Decrease the amount of caffeine you drink, or stop drinking caffeine.  Eat and exercise regularly.  Get 7 to 9 hours of sleep, or as recommended by your caregiver.  Avoid excessive use of pain medicine as recurrent headaches can occur.  SEEK MEDICAL CARE IF:   You have problems with the medicines you were prescribed.  Your medicines do not work.  You have a change from the usual headache.  You have nausea or vomiting. SEEK IMMEDIATE MEDICAL CARE IF:   Your headache becomes severe.  You have a fever.  You have a stiff neck.  You have loss of vision.  You have muscular weakness or loss of muscle control.  You lose your balance or have trouble walking.  You feel faint or pass out.  You have severe symptoms that are different from your first symptoms. MAKE SURE YOU:   Understand these instructions.  Will watch your condition.  Will get help right away if you are not doing well or get worse. Document Released: 03/27/2005 Document Revised: 06/19/2011 Document Reviewed: 03/17/2011 [facility] Patient Information 2014 [place], [place].

## 2013-05-13 NOTE — ED Provider Notes (Signed)
CSN: [id number]     Arrival date & time 05/13/13  1726 History   First MD Initiated Contact with Patient 05/13/13 1751     Chief Complaint  Patient presents with  . Headache   (Consider location/radiation/quality/duration/timing/severity/associated sxs/prior Treatment) HPI Comments: 33 year old female presents complaining of intermittent throbbing headaches for one week. The headaches occur in both temples, they come and go, and are not associated with any other symptoms. He has had headaches like this before but never this frequently. She denies any associated blurred vision, ringing in the ears, dizziness, lightheadedness, nausea, vomiting. She admits to a recent significant amount of increased stress, drinking too much soda, and never drinking any water.   Patient is a 33 y.o. female presenting with headaches.  Headache Associated symptoms: no abdominal pain, no cough, no dizziness, no fever, no myalgias, no nausea and no vomiting     History reviewed. No pertinent past medical history. History reviewed. No pertinent past surgical history. History reviewed. No pertinent family history. History  Substance Use Topics  . Smoking status: Never Smoker   . Smokeless tobacco: Not on file  . Alcohol Use: No   OB History   Grav Para Term Preterm Abortions TAB SAB Ect Mult Living                 Review of Systems  Constitutional: Negative for fever and chills.  Eyes: Negative for visual disturbance.  Respiratory: Negative for cough and shortness of breath.   Cardiovascular: Negative for chest pain, palpitations and leg swelling.  Gastrointestinal: Negative for nausea, vomiting and abdominal pain.  Endocrine: Negative for polydipsia and polyuria.  Genitourinary: Negative for dysuria, urgency and frequency.  Musculoskeletal: Negative for arthralgias and myalgias.  Skin: Negative for rash.  Neurological: Positive for headaches. Negative for dizziness, weakness and light-headedness.     Allergies  Penicillins  Home Medications   Current Outpatient Rx  Name  Route  Sig  Dispense  Refill  . clindamycin (CLEOCIN) 300 MG capsule   Oral   Take 300 mg by mouth 3 (three) times daily. 7 day course started 01/01/13         . HYDROcodone-acetaminophen (NORCO/VICODIN) 5-325 MG per tablet   Oral   Take 1 tablet by mouth every 4 (four) hours as needed for pain.   20 tablet   0   . ibuprofen (ADVIL,MOTRIN) 800 MG tablet   Oral   Take 800 mg by mouth 2 (two) times daily as needed for pain.          BP 122/78  Pulse 75  Temp(Src) 98.3 F (36.8 C) (Oral)  Resp 20  SpO2 100%  LMP 05/11/2013 Physical Exam  Nursing note and vitals reviewed. Constitutional: She is oriented to person, place, and time. Vital signs are normal. She appears well-developed and well-nourished. No distress.  HENT:  Head: Normocephalic and atraumatic.  Eyes: Conjunctivae and EOM are normal. Pupils are equal, round, and reactive to light. Right eye exhibits no discharge. Left eye exhibits no discharge. No scleral icterus.  Pulmonary/Chest: Effort normal. No respiratory distress.  Musculoskeletal: Normal range of motion.  Neurological: She is alert and oriented to person, place, and time. She has normal strength and normal reflexes. She displays normal reflexes. No cranial nerve deficit. She exhibits normal muscle tone. Coordination normal.  Skin: Skin is warm and dry. No rash noted. She is not diaphoretic.  Psychiatric: She has a normal mood and affect. Her behavior is normal. Judgment and thought content  normal.    ED Course  Procedures (including critical care time) Labs Review Labs Reviewed - No data to display Imaging Review No results found.    MDM   1. Tension type headache    Tension headache, no red flags. She has not tried any over-the-counter medications, advised Excedrin Migraine, increase fluids, and primary care followup.   Graylon GoodZachary H Taleigh Gero, PA-C 05/13/13 [phone]

## 2013-08-11 ENCOUNTER — Emergency Department (HOSPITAL_COMMUNITY)
Admission: EM | Admit: 2013-08-11 | Discharge: 2013-08-11 | Disposition: A | Discharge status: Home / Self Care | Payer: BC Managed Care – PPO | Attending: Emergency Medicine | Admitting: Emergency Medicine

## 2013-08-11 ENCOUNTER — Encounter (HOSPITAL_COMMUNITY): Payer: Self-pay | Admitting: Emergency Medicine

## 2013-08-11 ENCOUNTER — Emergency Department (HOSPITAL_COMMUNITY): Payer: BC Managed Care – PPO

## 2013-08-11 DIAGNOSIS — R05 Cough: Secondary | ICD-10-CM | POA: Insufficient documentation

## 2013-08-11 DIAGNOSIS — R079 Chest pain, unspecified: Secondary | ICD-10-CM

## 2013-08-11 DIAGNOSIS — Z880 Allergy status to penicillin: Secondary | ICD-10-CM | POA: Insufficient documentation

## 2013-08-11 DIAGNOSIS — R059 Cough, unspecified: Secondary | ICD-10-CM | POA: Insufficient documentation

## 2013-08-11 DIAGNOSIS — R072 Precordial pain: Secondary | ICD-10-CM | POA: Insufficient documentation

## 2013-08-11 DIAGNOSIS — R0602 Shortness of breath: Secondary | ICD-10-CM | POA: Insufficient documentation

## 2013-08-11 LAB — BASIC METABOLIC PANEL
BUN: 14 mg/dL (ref 6–23)
CHLORIDE: 101 meq/L (ref 96–112)
CO2: 25 meq/L (ref 19–32)
Calcium: 9.5 mg/dL (ref 8.4–10.5)
Creatinine, Ser: 0.93 mg/dL (ref 0.50–1.10)
GFR calc Af Amer: >90 mL/min (ref >90)
GFR calc non Af Amer: 80 mL/min — ABNORMAL LOW (ref >90)
GLUCOSE: 84 mg/dL (ref 70–99)
Potassium: 4 mEq/L (ref 3.7–5.3)
SODIUM: 139 meq/L (ref 137–147)

## 2013-08-11 LAB — CBC
HCT: 37.7 % (ref 36.0–46.0)
HEMOGLOBIN: 12.4 g/dL (ref 12.0–15.0)
MCH: 29 pg (ref 26.0–34.0)
MCHC: 32.9 g/dL (ref 30.0–36.0)
MCV: 88.1 fL (ref 78.0–100.0)
Platelets: 351 10*3/uL (ref 150–400)
RBC: 4.28 MIL/uL (ref 3.87–5.11)
RDW: 13.3 % (ref 11.5–15.5)
WBC: 11.3 10*3/uL — AB (ref 4.0–10.5)

## 2013-08-11 LAB — I-STAT TROPONIN, ED: Troponin i, poc: 0 ng/mL (ref 0.00–0.08)

## 2013-08-11 MED ORDER — IBUPROFEN 800 MG PO TABS
800.0000 mg | ORAL_TABLET | Freq: Once | ORAL | Status: AC
  Administered 2013-08-11: 800 mg via ORAL
  Filled 2013-08-11: qty 1

## 2013-08-11 MED ORDER — BENZONATATE 100 MG PO CAPS: 100.0000 mg | ORAL_CAPSULE | Freq: Three times a day (TID) | ORAL | Status: DC | PRN

## 2013-08-11 MED ORDER — IBUPROFEN 800 MG PO TABS: 800.0000 mg | ORAL_TABLET | Freq: Three times a day (TID) | ORAL | Status: AC

## 2013-08-11 NOTE — ED Notes (Signed)
Patient transported to X-ray 

## 2013-08-11 NOTE — Discharge Instructions (Signed)
As discussed, your evaluation today has been largely reassuring.  But, it is important that you monitor your condition carefully, and do not hesitate to return to the ED if you develop new, or concerning changes in your condition.  Your pain is likely due to inflammation about the chest wall.  However, please follow-up with your physician for appropriate ongoing care given your family history of heart disease.    Chest Pain (Nonspecific) It is often hard to give a specific diagnosis for the cause of chest pain. There is always a chance that your pain could be related to something serious, such as a heart attack or a blood clot in the lungs. You need to follow up with your caregiver for further evaluation. CAUSES   Heartburn.  Pneumonia or bronchitis.  Anxiety or stress.  Inflammation around your heart (pericarditis) or lung (pleuritis or pleurisy).  A blood clot in the lung.  A collapsed lung (pneumothorax). It can develop suddenly on its own (spontaneous pneumothorax) or from injury (trauma) to the chest.  Shingles infection (herpes zoster virus). The chest wall is composed of bones, muscles, and cartilage. Any of these can be the source of the pain.  The bones can be bruised by injury.  The muscles or cartilage can be strained by coughing or overwork.  The cartilage can be affected by inflammation and become sore (costochondritis). DIAGNOSIS  Lab tests or other studies, such as X-rays, electrocardiography, stress testing, or cardiac imaging, may be needed to find the cause of your pain.  TREATMENT   Treatment depends on what may be causing your chest pain. Treatment may include:  Acid blockers for heartburn.  Anti-inflammatory medicine.  Pain medicine for inflammatory conditions.  Antibiotics if an infection is present.  You may be advised to change lifestyle habits. This includes stopping smoking and avoiding alcohol, caffeine, and chocolate.  You may be advised to  keep your head raised (elevated) when sleeping. This reduces the chance of acid going backward from your stomach into your esophagus.  Most of the time, nonspecific chest pain will improve within 2 to 3 days with rest and mild pain medicine. HOME CARE INSTRUCTIONS   If antibiotics were prescribed, take your antibiotics as directed. Finish them even if you start to feel better.  For the next few days, avoid physical activities that bring on chest pain. Continue physical activities as directed.  Do not smoke.  Avoid drinking alcohol.  Only take over-the-counter or prescription medicine for pain, discomfort, or fever as directed by your caregiver.  Follow your caregiver's suggestions for further testing if your chest pain does not go away.  Keep any follow-up appointments you made. If you do not go to an appointment, you could develop lasting (chronic) problems with pain. If there is any problem keeping an appointment, you must call to reschedule. SEEK MEDICAL CARE IF:   You think you are having problems from the medicine you are taking. Read your medicine instructions carefully.  Your chest pain does not go away, even after treatment.  You develop a rash with blisters on your chest. SEEK IMMEDIATE MEDICAL CARE IF:   You have increased chest pain or pain that spreads to your arm, neck, jaw, back, or abdomen.  You develop shortness of breath, an increasing cough, or you are coughing up blood.  You have severe back or abdominal pain, feel nauseous, or vomit.  You develop severe weakness, fainting, or chills.  You have a fever. THIS IS AN EMERGENCY. Do  not wait to see if the pain will go away. Get medical help at once. Call your local emergency services (911 in [place] Do not drive yourself to the hospital. MAKE SURE YOU:   Understand these instructions.  Will watch your condition.  Will get help right away if you are not doing well or get worse. Document Released: 01/04/2005  Document Revised: 06/19/2011 Document Reviewed: 10/31/2007 [facility] Patient Information 2014 [place], [place]LL[place].

## 2013-08-11 NOTE — ED Notes (Signed)
Pt states started having mid chest pain this morning around 11 am, pt states she bent down to pick something up and had a sharp pain, pt states radiated down L arm but is currently not, pt denies numbness/tingling, pt denies n/v/d, pt denies blurred vision, states is having shortness of breath, pt states pain 7/10 pressure/heavy feeling.

## 2013-08-11 NOTE — ED Provider Notes (Signed)
CSN: [id number]     Arrival date & time 08/11/13  1933 History   First MD Initiated Contact with Patient 08/11/13 2138     Chief Complaint  Patient presents with  . Chest Pain  . Shortness of Breath      HPI  Patient presents with chest pain.  Pain began approximately 10 hours ago, without clear precipitant.  It has been ongoing cough, though no substantial pain or other complaints until chest pain began. Pain is sternal, intermittent, sore, pressure-like, with no sustained dyspnea, lightheadedness, fever, chills. Patient has not taken any medication for pain relief. She does not smoke, does not drink, has no travel history.   History reviewed. No pertinent past medical history. History reviewed. No pertinent past surgical history. No family history on file. History  Substance Use Topics  . Smoking status: Never Smoker   . Smokeless tobacco: Not on file  . Alcohol Use: No   OB History   Grav Para Term Preterm Abortions TAB SAB Ect Mult Living                 Review of Systems  Constitutional:       Per HPI, otherwise negative  HENT:       Per HPI, otherwise negative  Respiratory:       Per HPI, otherwise negative  Cardiovascular:       Per HPI, otherwise negative  Gastrointestinal: Negative for vomiting.  Endocrine:       Negative aside from HPI  Genitourinary:       Neg aside from HPI   Musculoskeletal:       Per HPI, otherwise negative  Skin: Negative.   Neurological: Negative for syncope.      Allergies  Mango flavor and Penicillins  Home Medications   Prior to Admission medications   Medication Sig Start Date End Date Taking? Authorizing Provider  aspirin-acetaminophen-caffeine (EXCEDRIN MIGRAINE) [id number] MG per tablet Take 1 tablet by mouth every 6 (six) hours as needed for headache.   Yes Historical Provider, MD   BP 138/79  Pulse 76  Temp(Src) 98.5 F (36.9 C) (Oral)  Resp 16  SpO2 100%  LMP 07/28/2013 Physical Exam  Nursing note and  vitals reviewed. Constitutional: She is oriented to person, place, and time. She appears well-developed and well-nourished. No distress.  HENT:  Head: Normocephalic and atraumatic.  Eyes: Conjunctivae and EOM are normal.  Cardiovascular: Normal rate and regular rhythm.   Pulmonary/Chest: Effort normal and breath sounds normal. No stridor. No respiratory distress.    Abdominal: She exhibits no distension.  Musculoskeletal: She exhibits no edema.  Neurological: She is alert and oriented to person, place, and time. No cranial nerve deficit.  Skin: Skin is warm and dry.  Psychiatric: She has a normal mood and affect.    ED Course  Procedures (including critical care time) Labs Review Labs Reviewed  CBC - Abnormal; Notable for the following:    WBC 11.3 (*)    All other components within normal limits  BASIC METABOLIC PANEL - Abnormal; Notable for the following:    GFR calc non Af Amer 80 (*)    All other components within normal limits  I-STAT TROPOININ, ED     EKG Interpretation   Date/Time:  Monday Aug 11 2013 19:42:34 EDT Ventricular Rate:  78 PR Interval:  123 QRS Duration: 83 QT Interval:  359 QTC Calculation: 409 R Axis:   69 Text Interpretation:  Sinus rhythm Sinus rhythm Artifact Borderline  ECG  Confirmed by Gerhard MunchLOCKWOOD, Haylen Shelnutt  MD [phone]([id number]) on 08/11/2013 9:39:01 PM     Pulse oximetry 100% room air normal  Chest x-ray demonstrates no opacification  11:00 PM On re-exam the patient is in no distress.  MDM  Patient presents with chest pain.  On exam she is awake, alert, afebrile, in no distress.  Patient's evaluation is reassuring.  Patient does have family history of coronary disease, but no risk factors herself, has stable vitals, labs, x-ray, was discharged with initiation of therapy for likely URI.    Gerhard Munchobert Shawny Borkowski, MD 08/11/13 2300

## 2013-08-27 ENCOUNTER — Emergency Department (HOSPITAL_COMMUNITY)
Admission: EM | Admit: 2013-08-27 | Discharge: 2013-08-27 | Disposition: A | Discharge status: Home / Self Care | Payer: BC Managed Care – PPO | Source: Home / Self Care | Attending: Family Medicine | Admitting: Family Medicine

## 2013-08-27 ENCOUNTER — Encounter (HOSPITAL_COMMUNITY): Payer: Self-pay | Admitting: Emergency Medicine

## 2013-08-27 DIAGNOSIS — J40 Bronchitis, not specified as acute or chronic: Secondary | ICD-10-CM

## 2013-08-27 MED ORDER — IPRATROPIUM-ALBUTEROL 0.5-2.5 (3) MG/3ML IN SOLN
3.0000 mL | Freq: Once | RESPIRATORY_TRACT | Status: AC
  Administered 2013-08-27: 3 mL via RESPIRATORY_TRACT

## 2013-08-27 MED ORDER — GUAIFENESIN-CODEINE 100-10 MG/5ML PO SOLN: 5.0000 mL | Freq: Every evening | ORAL | Status: AC | PRN

## 2013-08-27 MED ORDER — PREDNISONE 10 MG PO TABS: 30.0000 mg | ORAL_TABLET | Freq: Every day | ORAL | Status: AC

## 2013-08-27 MED ORDER — AZITHROMYCIN 250 MG PO TABS: 250.0000 mg | ORAL_TABLET | Freq: Every day | ORAL | Status: AC

## 2013-08-27 MED ORDER — IPRATROPIUM-ALBUTEROL 0.5-2.5 (3) MG/3ML IN SOLN
RESPIRATORY_TRACT | Status: AC
  Filled 2013-08-27: qty 3

## 2013-08-27 MED ORDER — ALBUTEROL SULFATE HFA 108 (90 BASE) MCG/ACT IN AERS: 2.0000 | INHALATION_SPRAY | Freq: Four times a day (QID) | RESPIRATORY_TRACT | Status: AC | PRN

## 2013-08-27 NOTE — Discharge Instructions (Signed)
Thank you for coming in today. Call or go to the emergency room if you get worse, have trouble breathing, have chest pains, or palpitations.  Do not drive after taking the cough medicine.    Bronchitis Bronchitis is inflammation of the airways that extend from the windpipe into the lungs (bronchi). The inflammation often causes mucus to develop, which leads to a cough. If the inflammation becomes severe, it may cause shortness of breath. CAUSES  Bronchitis may be caused by:   Viral infections.   Bacteria.   Cigarette smoke.   Allergens, pollutants, and other irritants.  SIGNS AND SYMPTOMS  The most common symptom of bronchitis is a frequent cough that produces mucus. Other symptoms include:  Fever.   Body aches.   Chest congestion.   Chills.   Shortness of breath.   Sore throat.  DIAGNOSIS  Bronchitis is usually diagnosed through a medical history and physical exam. Tests, such as chest X-rays, are sometimes done to rule out other conditions.  TREATMENT  You may need to avoid contact with whatever caused the problem (smoking, for example). Medicines are sometimes needed. These may include:  Antibiotics. These may be prescribed if the condition is caused by bacteria.  Cough suppressants. These may be prescribed for relief of cough symptoms.   Inhaled medicines. These may be prescribed to help open your airways and make it easier for you to breathe.   Steroid medicines. These may be prescribed for those with recurrent (chronic) bronchitis. HOME CARE INSTRUCTIONS  Get plenty of rest.   Drink enough fluids to keep your urine clear or pale yellow (unless you have a medical condition that requires fluid restriction). Increasing fluids may help thin your secretions and will prevent dehydration.   Only take over-the-counter or prescription medicines as directed by your health care provider.  Only take antibiotics as directed. Make sure you finish them even if  you start to feel better.  Avoid secondhand smoke, irritating chemicals, and strong fumes. These will make bronchitis worse. If you are a smoker, quit smoking. Consider using nicotine gum or skin patches to help control withdrawal symptoms. Quitting smoking will help your lungs heal faster.   Put a cool-mist humidifier in your bedroom at night to moisten the air. This may help loosen mucus. Change the water in the humidifier daily. You can also run the hot water in your shower and sit in the bathroom with the door closed for 5 10 minutes.   Follow up with your health care provider as directed.   Wash your hands frequently to avoid catching bronchitis again or spreading an infection to others.  SEEK MEDICAL CARE IF: Your symptoms do not improve after 1 week of treatment.  SEEK IMMEDIATE MEDICAL CARE IF:  Your fever increases.  You have chills.   You have chest pain.   You have worsening shortness of breath.   You have bloody sputum.  You faint.  You have lightheadedness.  You have a severe headache.   You vomit repeatedly. MAKE SURE YOU:   Understand these instructions.  Will watch your condition.  Will get help right away if you are not doing well or get worse. Document Released: 03/27/2005 Document Revised: 01/15/2013 Document Reviewed: 11/19/2012 [facility]Exit[facility] Patient Information 2014 [place]ExitCare, [place].

## 2013-08-27 NOTE — ED Provider Notes (Signed)
Christina BinetLatoya Wheeler is a 33 y.o. female who presents to Urgent Care today for cough and congestion present for 4 weeks. No shortness of breath nausea vomiting fevers or chills. She was seen emergency department may fourth. She was given Tessalon which has not helped much. Chest x-ray at that time was negative. Her symptoms have persisted and are interfering with sleep.   History reviewed. No pertinent past medical history. History  Substance Use Topics  . Smoking status: Never Smoker   . Smokeless tobacco: Not on file  . Alcohol Use: Yes   ROS as above Medications: No current facility-administered medications for this encounter.   Current Outpatient Prescriptions  Medication Sig Dispense Refill  . albuterol (PROVENTIL HFA;VENTOLIN HFA) 108 (90 BASE) MCG/ACT inhaler Inhale 2 puffs into the lungs every 6 (six) hours as needed for wheezing or shortness of breath.  1 Inhaler  2  . azithromycin (ZITHROMAX) 250 MG tablet Take 1 tablet (250 mg total) by mouth daily. Take first 2 tablets together, then 1 every day until finished.  6 tablet  0  . guaiFENesin-codeine 100-10 MG/5ML syrup Take 5 mLs by mouth at bedtime as needed for cough.  120 mL  0  . predniSONE (DELTASONE) 10 MG tablet Take 3 tablets (30 mg total) by mouth daily.  15 tablet  0    Exam:  BP 129/82  Pulse 64  Temp(Src) 97.6 F (36.4 C) (Oral)  Resp 14  SpO2 100%  LMP 08/23/2013 Gen: Well NAD HEENT: EOMI,  MMM cobblestoning of posterior pharynx. Tympanic membranes are normal appearing bilaterally. Lungs: Normal work of breathing. CTABL Heart: RRR no MRG Abd: NABS, Soft. NT, ND Exts: Brisk capillary refill, warm and well perfused.   She was given a DuoNeb nebulizer treatment and felt much better  No results found for this or any previous visit (from the past 24 hour(s)). No results found.  Assessment and Plan: 33 y.o. female with bronchitis. Plan to treat with prednisone and azithromycin albuterol and codeine containing  cough medication.  Discussed warning signs or symptoms. Please see discharge instructions. Patient expresses understanding.    Rodolph BongEvan S Corey, MD 08/27/13 [phone]

## 2013-08-27 NOTE — ED Notes (Signed)
Pt assessed and triaged by provider.   Provider in before nurse.  

## 2014-08-13 ENCOUNTER — Encounter (HOSPITAL_COMMUNITY): Payer: Self-pay | Admitting: *Deleted

## 2014-08-13 ENCOUNTER — Emergency Department (INDEPENDENT_AMBULATORY_CARE_PROVIDER_SITE_OTHER)
Admission: EM | Admit: 2014-08-13 | Discharge: 2014-08-13 | Disposition: A | Discharge status: Home / Self Care | Payer: Self-pay | Source: Home / Self Care | Attending: Family Medicine | Admitting: Family Medicine

## 2014-08-13 DIAGNOSIS — H6123 Impacted cerumen, bilateral: Secondary | ICD-10-CM

## 2014-08-13 DIAGNOSIS — H6092 Unspecified otitis externa, left ear: Secondary | ICD-10-CM

## 2014-08-13 MED ORDER — DOCUSATE SODIUM 50 MG/5ML PO LIQD
50.0000 mg | Freq: Once | ORAL | Status: AC
  Administered 2014-08-13: 50 mg via ORAL

## 2014-08-13 MED ORDER — DOCUSATE SODIUM 50 MG/5ML PO LIQD
ORAL | Status: AC
  Filled 2014-08-13: qty 10

## 2014-08-13 MED ORDER — NEOMYCIN-POLYMYXIN-HC 3.5-10000-1 OT SUSP: 4.0000 [drp] | Freq: Three times a day (TID) | OTIC | Status: DC

## 2014-08-13 MED ORDER — ACETAMINOPHEN 325 MG PO TABS
650.0000 mg | ORAL_TABLET | Freq: Once | ORAL | Status: AC
  Administered 2014-08-13: 650 mg via ORAL

## 2014-08-13 MED ORDER — ACETAMINOPHEN 325 MG PO TABS
ORAL_TABLET | ORAL | Status: AC
  Filled 2014-08-13: qty 2

## 2014-08-13 NOTE — ED Notes (Signed)
L  Earache   Possibly  Clogged    Also  Reports  Eyes puffy         With  Symptoms  X  2  Days        Sitting  Upright  On  The exam table  Speaking in  Complete  sentances

## 2014-08-13 NOTE — ED Provider Notes (Signed)
CSN: [id number]     Arrival date & time 08/13/14  0817 History   First MD Initiated Contact with Patient 08/13/14 [phone]     Chief Complaint  Patient presents with  . Otalgia   (Consider location/radiation/quality/duration/timing/severity/associated sxs/prior Treatment) Patient is a 34 y.o. female presenting with ear pain.  Otalgia Location:  Left Severity:  Moderate Onset quality:  Gradual Duration:  1 week Timing:  Constant Progression:  Worsening Chronicity:  Recurrent (hx of cerumen impaction in past) Associated symptoms: hearing loss   Associated symptoms: no ear discharge and no fever     History reviewed. No pertinent past medical history. History reviewed. No pertinent past surgical history. History reviewed. No pertinent family history. History  Substance Use Topics  . Smoking status: Current Every Day Smoker -- 1.00 packs/day for 7 years    Types: Cigarettes  . Smokeless tobacco: Not on file  . Alcohol Use: No     Comment: quit 38 days ago 03/09/11 todays date   OB History    No data available     Review of Systems  Constitutional: Negative for fever and chills.  HENT: Positive for ear pain and hearing loss. Negative for ear discharge.   Eyes: Negative.   Respiratory: Negative.   Cardiovascular: Negative.     Allergies  Review of patient's allergies indicates no known allergies.  Home Medications   Prior to Admission medications   Medication Sig Start Date End Date Taking? Authorizing Provider  albuterol (PROVENTIL HFA;VENTOLIN HFA) 108 (90 BASE) MCG/ACT inhaler Inhale 2 puffs into the lungs every 6 (six) hours as needed for wheezing. 11/08/12   Josalyn Funches, MD  ibuprofen (ADVIL,MOTRIN) 600 MG tablet Take 1 tablet (600 mg total) by mouth every 8 (eight) hours as needed for pain. 11/08/12   Dessa PhiJosalyn Funches, MD  levonorgestrel (MIRENA) 20 MCG/24HR IUD 1 each by Intrauterine route once. 01/30/11 01/30/16  Macy MisKim K Briscoe, MD  neomycin-polymyxin-hydrocortisone  (CORTISPORIN) 3.5-10000-1 otic suspension Place 4 drops into the left ear 3 (three) times daily. X 7 days 08/13/14   Jess BartersJennifer Lee H Rayn Enderson, PA   BP 121/83 mmHg  Pulse 71  Temp(Src) 98.1 F (36.7 C) (Oral)  Resp 14  SpO2 97% Physical Exam  Constitutional: She is oriented to person, place, and time. She appears well-developed and well-nourished. No distress.  HENT:  Head: Normocephalic and atraumatic.  Right Ear: Hearing, tympanic membrane, external ear and ear canal normal.  Left Ear: There is swelling and tenderness. No mastoid tenderness. Decreased hearing is noted.  Nose: Nose normal.  Mouth/Throat: Uvula is midline, oropharynx is clear and moist and mucous membranes are normal.  Left cerumen impaction   Eyes: Conjunctivae are normal. No scleral icterus.  Neck: Normal range of motion. Neck supple.  Cardiovascular: Normal rate, regular rhythm and normal heart sounds.   Pulmonary/Chest: Effort normal and breath sounds normal.  Musculoskeletal: Normal range of motion.  Lymphadenopathy:    She has no cervical adenopathy.  Neurological: She is alert and oriented to person, place, and time.  Skin: Skin is warm and dry.  Psychiatric: She has a normal mood and affect. Her behavior is normal.  Nursing note and vitals reviewed.   ED Course  Procedures (including critical care time) Labs Review Labs Reviewed - No data to display  Imaging Review No results found.   MDM   1. Cerumen impaction, bilateral   2. Otitis externa of left ear   Majority of cerumen irrigated from left ear. Additional cerumen removed  with curette. Still with small amount of residual ear wax. Exam of ear canal following removal consistent with otitis externa. Canal erythematous, moderately swollen and adherent white exudate on TM. Will treat with cortisporin ear drops and advised PCP follow up if no improvement. Tylenol or ibuprofen as directed on packaging for pain.     Ria ClockJennifer Lee H Jilian West, PA 08/13/14  1241  Ria ClockJennifer Lee H Marlei Glomski, [place] 08/13/14 [phone]

## 2014-08-13 NOTE — Discharge Instructions (Signed)
Cerumen Impaction °A cerumen impaction is when the wax in your ear forms a plug. This plug usually causes reduced hearing. Sometimes it also causes an earache or dizziness. Removing a cerumen impaction can be difficult and painful. The wax sticks to the ear canal. The canal is sensitive and bleeds easily. If you try to remove a heavy wax buildup with a cotton tipped swab, you may push it in further. °Irrigation with water, suction, and small ear curettes may be used to clear out the wax. If the impaction is fixed to the skin in the ear canal, ear drops may be needed for a few days to loosen the wax. People who build up a lot of wax frequently can use ear wax removal products available in your local drugstore. °SEEK MEDICAL CARE IF:  °You develop an earache, increased hearing loss, or marked dizziness. °Document Released: 05/04/2004 Document Revised: 06/19/2011 Document Reviewed: 06/24/2009 °ExitCare® Patient Information ©2015 [employer]. This information is not intended to replace advice given to you by your health care provider. Make sure you discuss any questions you have with your health care provider. ° °Otitis Externa °Otitis externa is a bacterial or fungal infection of the outer ear canal. This is the area from the eardrum to the outside of the ear. Otitis externa is sometimes called "swimmer's ear." °CAUSES  °Possible causes of infection include: °· Swimming in dirty water. °· Moisture remaining in the ear after swimming or bathing. °· Mild injury (trauma) to the ear. °· Objects stuck in the ear (foreign body). °· Cuts or scrapes (abrasions) on the outside of the ear. °SIGNS AND SYMPTOMS  °The first symptom of infection is often itching in the ear canal. Later signs and symptoms may include swelling and redness of the ear canal, ear pain, and yellowish-white fluid (pus) coming from the ear. The ear pain may be worse when pulling on the earlobe. °DIAGNOSIS  °Your health care provider will perform a  physical exam. A sample of fluid may be taken from the ear and examined for bacteria or fungi. °TREATMENT  °Antibiotic ear drops are often given for 10 to 14 days. Treatment may also include pain medicine or corticosteroids to reduce itching and swelling. °HOME CARE INSTRUCTIONS  °· Apply antibiotic ear drops to the ear canal as prescribed by your health care provider. °· Take medicines only as directed by your health care provider. °· If you have diabetes, follow any additional treatment instructions from your health care provider. °· Keep all follow-up visits as directed by your health care provider. °PREVENTION  °· Keep your ear dry. Use the corner of a towel to absorb water out of the ear canal after swimming or bathing. °· Avoid scratching or putting objects inside your ear. This can damage the ear canal or remove the protective wax that lines the canal. This makes it easier for bacteria and fungi to grow. °· Avoid swimming in lakes, polluted water, or poorly chlorinated pools. °· You may use ear drops made of rubbing alcohol and vinegar after swimming. Combine equal parts of white vinegar and alcohol in a bottle. Put 3 or 4 drops into each ear after swimming. °SEEK MEDICAL CARE IF:  °· You have a fever. °· Your ear is still red, swollen, painful, or draining pus after 3 days. °· Your redness, swelling, or pain gets worse. °· You have a severe headache. °· You have redness, swelling, pain, or tenderness in the area behind your ear. °MAKE SURE YOU:  °·   Understand these instructions. °· Will watch your condition. °· Will get help right away if you are not doing well or get worse. °Document Released: 03/27/2005 Document Revised: 08/11/2013 Document Reviewed: 04/13/2011 °ExitCare® Patient Information ©2015 [employer]. This information is not intended to replace advice given to you by your health care provider. Make sure you discuss any questions you have with your health care provider. ° °

## 2014-11-06 ENCOUNTER — Encounter: Payer: Self-pay | Admitting: Family Medicine

## 2014-11-06 ENCOUNTER — Ambulatory Visit (INDEPENDENT_AMBULATORY_CARE_PROVIDER_SITE_OTHER): Payer: Medicaid Other | Admitting: Family Medicine

## 2014-11-06 VITALS — BP 129/81 | HR 62 | Temp 98.4°F | Ht 62.0 in | Wt 125.0 lb

## 2014-11-06 DIAGNOSIS — L309 Dermatitis, unspecified: Secondary | ICD-10-CM | POA: Insufficient documentation

## 2014-11-06 MED ORDER — BETAMETHASONE VALERATE 0.1 % EX CREA
TOPICAL_CREAM | Freq: Two times a day (BID) | CUTANEOUS | Status: DC
Start: 2014-11-06 — End: 2015-07-13

## 2014-11-06 NOTE — Assessment & Plan Note (Signed)
??   Contact dermatitis from the necklace she is wearing vs atopic dermatitis. I recommended avoidance of necklace for now. Switch back to dove body soap. Continue Eucerin cream prn. Start Betamethasone topical steroid BID. Follow up soon if no improvement. Prescription sent to her pharmacy.

## 2014-11-06 NOTE — Patient Instructions (Signed)
Eczema Eczema, also called atopic dermatitis, is a skin disorder that causes inflammation of the skin. It causes a red rash and dry, scaly skin. The skin becomes very itchy. Eczema is generally worse during the cooler winter months and often improves with the warmth of summer. Eczema usually starts showing signs in infancy. Some children outgrow eczema, but it may last through adulthood.  CAUSES  The exact cause of eczema is not known, but it appears to run in families. People with eczema often have a family history of eczema, allergies, asthma, or hay fever. Eczema is not contagious. Flare-ups of the condition may be caused by:   Contact with something you are sensitive or allergic to.   Stress. SIGNS AND SYMPTOMS  Dry, scaly skin.   Red, itchy rash.   Itchiness. This may occur before the skin rash and may be very intense.  DIAGNOSIS  The diagnosis of eczema is usually made based on symptoms and medical history. TREATMENT  Eczema cannot be cured, but symptoms usually can be controlled with treatment and other strategies. A treatment plan might include:  Controlling the itching and scratching.   Use over-the-counter antihistamines as directed for itching. This is especially useful at night when the itching tends to be worse.   Use over-the-counter steroid creams as directed for itching.   Avoid scratching. Scratching makes the rash and itching worse. It may also result in a skin infection (impetigo) due to a break in the skin caused by scratching.   Keeping the skin well moisturized with creams every day. This will seal in moisture and help prevent dryness. Lotions that contain alcohol and water should be avoided because they can dry the skin.   Limiting exposure to things that you are sensitive or allergic to (allergens).   Recognizing situations that cause stress.   Developing a plan to manage stress.  HOME CARE INSTRUCTIONS   Only take over-the-counter or  prescription medicines as directed by your health care provider.   Do not use anything on the skin without checking with your health care provider.   Keep baths or showers short (5 minutes) in warm (not hot) water. Use mild cleansers for bathing. These should be unscented. You may add nonperfumed bath oil to the bath water. It is best to avoid soap and bubble bath.   Immediately after a bath or shower, when the skin is still damp, apply a moisturizing ointment to the entire body. This ointment should be a petroleum ointment. This will seal in moisture and help prevent dryness. The thicker the ointment, the better. These should be unscented.   Keep fingernails cut short. Children with eczema may need to wear soft gloves or mittens at night after applying an ointment.   Dress in clothes made of cotton or cotton blends. Dress lightly, because heat increases itching.   A child with eczema should stay away from anyone with fever blisters or cold sores. The virus that causes fever blisters (herpes simplex) can cause a serious skin infection in children with eczema. SEEK MEDICAL CARE IF:   Your itching interferes with sleep.   Your rash gets worse or is not better within 1 week after starting treatment.   You see pus or soft yellow scabs in the rash area.   You have a fever.   You have a rash flare-up after contact with someone who has fever blisters.  Document Released: 03/24/2000 Document Revised: 01/15/2013 Document Reviewed: 10/28/2012 ExitCare Patient Information 2015 [employer]. This information   is not intended to replace advice given to you by your health care provider. Make sure you discuss any questions you have with your health care provider.  

## 2014-11-06 NOTE — Progress Notes (Signed)
Subjective:     Patient ID: Julia May, female   DOB: 18-Jul-1980, 34 y.o.   MRN: [id number]  Rash This is a recurrent problem. The current episode started more than 1 month ago. The problem has been gradually worsening since onset. Location: Itchy dark scaly rash on the back of the neck. The rash is characterized by dryness, itchiness and scaling. She was exposed to a new detergent/soap (Changed from dove to dial.). Pertinent negatives include no congestion, cough or fever. Treatments tried: Eucerine with minimal improveemnt.   Current Outpatient Prescriptions on File Prior to Visit  Medication Sig Dispense Refill  . levonorgestrel (MIRENA) 20 MCG/24HR IUD 1 each by Intrauterine route once. 1 each 0  . albuterol (PROVENTIL HFA;VENTOLIN HFA) 108 (90 BASE) MCG/ACT inhaler Inhale 2 puffs into the lungs every 6 (six) hours as needed for wheezing. (Patient not taking: Reported on 11/06/2014) 1 Inhaler 1   No current facility-administered medications on file prior to visit.   No past medical history on file.    Review of Systems  Constitutional: Negative for fever.  HENT: Negative for congestion.   Respiratory: Negative for cough.   Skin: Positive for rash.   Filed Vitals:   11/06/14 0914  BP: 129/81  Pulse: 62  Temp: 98.4 F (36.9 C)  TempSrc: Oral  Height:  (1.575 m)  Weight: 125 lb (56.7 kg)       Objective:   Physical Exam  Constitutional: She appears well-developed.  Cardiovascular: Normal rate, regular rhythm and normal heart sounds.   No murmur heard. Pulmonary/Chest: Effort normal and breath sounds normal. No respiratory distress. She has no wheezes.  Skin:     Nursing note and vitals reviewed.      Assessment:     Dermatitis: Contact vs Atopy     Plan:     Check problem list

## 2014-11-11 ENCOUNTER — Telehealth: Payer: Self-pay | Admitting: *Deleted

## 2014-11-11 NOTE — Telephone Encounter (Signed)
I again contacted pharmacy. Betamethasone Valerate should be covered by patient's insurance since this is on the preferred medicaid list. The pharmacist then told me she does not think the problem is with prior authorization, but it seems patient's medicaid had expired. I called to discuss with patient but she did not pick up. Pharmacist advised to keep on the Betamethasone valerate instead and we will try to reach patient about her insurance.  Prior authorization form discarded in a shredder since it is not needed at this time.

## 2014-11-11 NOTE — Telephone Encounter (Signed)
Received fax from [facility] requesting prior authorization of betamethasone VA cream .  Form placed in MD's box for completion along with Medicaid formulary.  Altamese Dilling, BSN, RN-BC

## 2014-11-11 NOTE — Telephone Encounter (Signed)
I contacted her pharmacy and switch betamethasone valerate to dipropionate which should not need prior authorization.  Please advise patient to pick up med.

## 2014-12-25 ENCOUNTER — Encounter: Payer: Self-pay | Admitting: Family Medicine

## 2014-12-25 ENCOUNTER — Ambulatory Visit (INDEPENDENT_AMBULATORY_CARE_PROVIDER_SITE_OTHER): Payer: Medicaid Other | Admitting: Family Medicine

## 2014-12-25 VITALS — BP 128/83 | HR 73 | Temp 98.3°F | Ht 62.0 in | Wt 116.0 lb

## 2014-12-25 DIAGNOSIS — K1379 Other lesions of oral mucosa: Secondary | ICD-10-CM | POA: Diagnosis not present

## 2014-12-25 DIAGNOSIS — K130 Diseases of lips: Secondary | ICD-10-CM

## 2014-12-25 DIAGNOSIS — L989 Disorder of the skin and subcutaneous tissue, unspecified: Secondary | ICD-10-CM

## 2014-12-25 NOTE — Patient Instructions (Signed)
Thank you for coming in,   You can take tylenol or ibuprofen for pain   You can get moleskin bandages to place over the spot on your foot. This will help the rubbing against your shoes.   If there is no improvement with the bandages, please let us know.   Sign up for My Chart to have easy access to your labs results, and communication with your Primary care physician   Please feel free to call with any questions or concerns at any time, at [phone]. --Dr. Jordan Likes

## 2014-12-25 NOTE — Progress Notes (Signed)
   Subjective:    Patient ID: Julia May, female    DOB: 02-12-1981, 34 y.o.   MRN: [id number]  Seen for Same day visit for   CC: rash/sore   Mouth sore  Occuring on the top of her lip on the right lateral side On and off for years.  Just came back about three days  Now it is painful.  Previously she has tried to pop the area. Clear liquid has come out before.  3/10 in pain. Sharp pain  May be related to stress   Foot sore  Started two weeks  Started as a blister and it has been rubbing on her shoe  Not painful  Has been putting tinactin. Has been helpign but coming back  Staying the same  Occuring the medial aspect of left foot just lateral of her left great toe. Near the first MTP joint.  Hx of eczema.   Review of Systems   See HPI for ROS. Objective:  BP 128/83 mmHg  Pulse 73  Temp(Src) 98.3 F (36.8 C) (Oral)  Ht  (1.575 m)  Wt 116 lb (52.617 kg)  BMI 21.21 kg/m2  General: NAD HEENT: annular 3 mm white, pustular lesion on the right upper lip. Lesion is TTP.  white annular lesions on the right side of the soft palate, no tonsillar exudates, clear conjunctiva, TM's clear and intact, MMM,  Extremities: no edema or cyanosis. WWP. Skin: Raised lesion on the medial aspect of her left forefoot, medial to the first MTP. No streaking and not scaly.  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

## 2014-12-26 DIAGNOSIS — K130 Diseases of lips: Secondary | ICD-10-CM | POA: Insufficient documentation

## 2014-12-26 DIAGNOSIS — B353 Tinea pedis: Secondary | ICD-10-CM | POA: Insufficient documentation

## 2014-12-26 NOTE — Assessment & Plan Note (Signed)
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

## 2014-12-26 NOTE — Assessment & Plan Note (Signed)
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

## 2014-12-28 LAB — HERPES SIMPLEX VIRUS CULTURE: ORGANISM ID, BACTERIA: NOT DETECTED

## 2014-12-30 ENCOUNTER — Telehealth: Payer: Self-pay | Admitting: Family Medicine

## 2014-12-30 NOTE — Telephone Encounter (Signed)
PT informed. Zimmerman Rumple, April D, CMA  

## 2014-12-30 NOTE — Telephone Encounter (Signed)
Left VM for patient. If she calls back please have her speak with a nurse/CMA and let her know that her herpes culture was negative.   If any questions then please take the best time and phone number to call and I will try to call her back.   Myra Rude, MD PGY-3, [facility] 12/30/2014, 11:08 AM

## 2015-01-11 ENCOUNTER — Ambulatory Visit (INDEPENDENT_AMBULATORY_CARE_PROVIDER_SITE_OTHER): Payer: Medicaid Other | Admitting: Family Medicine

## 2015-01-11 ENCOUNTER — Encounter: Payer: Self-pay | Admitting: Family Medicine

## 2015-01-15 NOTE — Progress Notes (Signed)
ERRONEOUS ENCOUNTER PLEASE DISREGARD

## 2015-03-15 ENCOUNTER — Other Ambulatory Visit: Payer: Self-pay | Admitting: *Deleted

## 2015-03-15 NOTE — Telephone Encounter (Signed)
Patient does not need to be on medication for a prolonged period. She was prescribed albuterol a while ago for viral infection of her lungs with wheezing. Please have her come in for Asthma evaluation prior to refill of albuterol unless she is having some symptoms. Either way she needs to be seen.

## 2015-04-01 ENCOUNTER — Encounter (HOSPITAL_COMMUNITY): Payer: Self-pay | Admitting: Emergency Medicine

## 2015-04-01 ENCOUNTER — Emergency Department (INDEPENDENT_AMBULATORY_CARE_PROVIDER_SITE_OTHER)
Admission: EM | Admit: 2015-04-01 | Discharge: 2015-04-01 | Disposition: A | Discharge status: Home / Self Care | Payer: Medicaid Other | Source: Home / Self Care | Attending: Family Medicine | Admitting: Family Medicine

## 2015-04-01 DIAGNOSIS — H6092 Unspecified otitis externa, left ear: Secondary | ICD-10-CM | POA: Diagnosis not present

## 2015-04-01 MED ORDER — CIPROFLOXACIN-DEXAMETHASONE 0.3-0.1 % OT SUSP: 4.0000 [drp] | Freq: Two times a day (BID) | OTIC | Status: AC

## 2015-04-01 NOTE — ED Notes (Signed)
C/o left ear pain onset x1 week associated w/dizziness Denies fevers, cold sx A&O x4... No acute distress.

## 2015-04-01 NOTE — ED Provider Notes (Signed)
CSN: [id number]     Arrival date & time 04/01/15  1824 History   First MD Initiated Contact with Patient 04/01/15 1908     Chief Complaint  Patient presents with  . Otalgia   (Consider location/radiation/quality/duration/timing/severity/associated sxs/prior Treatment) Patient is a 34 y.o. female presenting with ear pain. The history is provided by the patient. No language interpreter was used.  Otalgia Associated symptoms: cough   Associated symptoms: no congestion, no ear discharge and no fever    Patient with complaint of 1 week of pain in L ear, reminiscent of prior episodes of swimmers ear. Most recently had earlier this Fall. Has had some decreased hearing, as well as some dizziness associated. Tylenol for pain not helping much.   ROS: No fevers or chills, some mild dry cough without shortness of breath associated with current episode of illness. No chest pain, no sore throat.  History reviewed. No pertinent past medical history. History reviewed. No pertinent past surgical history. No family history on file. Social History  Substance Use Topics  . Smoking status: Current Every Day Smoker -- 1.00 packs/day for 7 years    Types: Cigarettes  . Smokeless tobacco: None  . Alcohol Use: No     Comment: quit 38 days ago 03/09/11 todays date   OB History    No data available     Review of Systems  Constitutional: Negative for fever, chills and fatigue.  HENT: Positive for ear pain. Negative for congestion and ear discharge.   Respiratory: Positive for cough. Negative for chest tightness, shortness of breath and wheezing.   Cardiovascular: Negative for chest pain.  All other systems reviewed and are negative.   Allergies  Review of patient's allergies indicates no known allergies.  Home Medications   Prior to Admission medications   Medication Sig Start Date End Date Taking? Authorizing Provider  albuterol (PROVENTIL HFA;VENTOLIN HFA) 108 (90 BASE) MCG/ACT inhaler Inhale 2  puffs into the lungs every 6 (six) hours as needed for wheezing. Patient not taking: Reported on 11/06/2014 11/08/12   Dessa PhiJosalyn Funches, MD  betamethasone valerate (VALISONE) 0.1 % cream Apply topically 2 (two) times daily. 11/06/14   Doreene ElandKehinde T Eniola, MD  ciprofloxacin-dexamethasone (CIPRODEX) otic suspension Place 4 drops into the left ear 2 (two) times daily. 04/01/15 04/08/15  Barbaraann BarthelJames O Jamy Cleckler, MD  levonorgestrel (MIRENA) 20 MCG/24HR IUD 1 each by Intrauterine route once. 01/30/11 01/30/16  Macy MisKim K Briscoe, MD   Meds Ordered and Administered this Visit  Medications - No data to display  There were no vitals taken for this visit. No data found.   Physical Exam  Constitutional: She appears well-developed and well-nourished. No distress.  HENT:  Head: Normocephalic and atraumatic.  Right Ear: External ear normal.  Nose: Nose normal.  Mouth/Throat: Oropharynx is clear and moist. No oropharyngeal exudate.  Left EAC with debris, erythema and pain with insertion of ear speculum. Tenderness to palpate pinna and tragus of L ear.   Eyes: Conjunctivae and EOM are normal. Pupils are equal, round, and reactive to light. Right eye exhibits no discharge. Left eye exhibits no discharge.  Neck: Neck supple. No thyromegaly present.  Cardiovascular: Normal rate, regular rhythm and normal heart sounds.   Pulmonary/Chest: Effort normal and breath sounds normal. No respiratory distress. She has no wheezes. She has no rales. She exhibits no tenderness.  Lymphadenopathy:    She has no cervical adenopathy.  Skin: She is not diaphoretic.    ED Course  Procedures (including critical care  time)  Labs Review Labs Reviewed - No data to display  Imaging Review No results found.   Visual Acuity Review  Right Eye Distance:   Left Eye Distance:   Bilateral Distance:    Right Eye Near:   Left Eye Near:    Bilateral Near:         MDM   1. Otitis externa of left ear    Patient with L otitis externa,  treatment with CiproDex drops, indications for return to primary care physician or [facility].     Barbaraann Barthel, MD 04/01/15 [phone]

## 2015-04-01 NOTE — Discharge Instructions (Signed)
It was a pleasure to see you today. I believe you have an infection in the external ear canal ("swimmers ear").   I am prescribing CIPRODEX ear drops, place 4 drops in the left ear twice daily for seven days.   Ibuprofen 200mg  tablets, take 2 to 4 tablets by mouth every 6 hours as needed for pain.   [facility] if [facility] experience worsening, or with other concerns.

## 2015-05-11 ENCOUNTER — Telehealth: Payer: Self-pay | Admitting: Family Medicine

## 2015-05-11 ENCOUNTER — Ambulatory Visit (INDEPENDENT_AMBULATORY_CARE_PROVIDER_SITE_OTHER): Payer: Medicaid Other | Admitting: Family Medicine

## 2015-05-11 ENCOUNTER — Encounter: Payer: Self-pay | Admitting: Family Medicine

## 2015-05-11 ENCOUNTER — Other Ambulatory Visit (HOSPITAL_COMMUNITY)
Admission: RE | Admit: 2015-05-11 | Discharge: 2015-05-11 | Disposition: A | Discharge status: Home / Self Care | Payer: Medicaid Other | Source: Ambulatory Visit | Attending: Family Medicine | Admitting: Family Medicine

## 2015-05-11 VITALS — BP 132/90 | HR 82 | Temp 98.3°F | Ht 62.0 in | Wt 118.0 lb

## 2015-05-11 DIAGNOSIS — R1031 Right lower quadrant pain: Secondary | ICD-10-CM

## 2015-05-11 DIAGNOSIS — Z113 Encounter for screening for infections with a predominantly sexual mode of transmission: Secondary | ICD-10-CM | POA: Insufficient documentation

## 2015-05-11 DIAGNOSIS — N898 Other specified noninflammatory disorders of vagina: Secondary | ICD-10-CM

## 2015-05-11 DIAGNOSIS — N644 Mastodynia: Secondary | ICD-10-CM

## 2015-05-11 DIAGNOSIS — Z0000 Encounter for general adult medical examination without abnormal findings: Secondary | ICD-10-CM | POA: Diagnosis present

## 2015-05-11 DIAGNOSIS — IMO0001 Reserved for inherently not codable concepts without codable children: Secondary | ICD-10-CM

## 2015-05-11 LAB — POCT WET PREP (WET MOUNT): Clue Cells Wet Prep Whiff POC: POSITIVE

## 2015-05-11 MED ORDER — METRONIDAZOLE 500 MG PO TABS: 500.0000 mg | ORAL_TABLET | Freq: Three times a day (TID) | ORAL | Status: DC

## 2015-05-11 MED ORDER — METRONIDAZOLE 500 MG PO TABS: 500.0000 mg | ORAL_TABLET | Freq: Two times a day (BID) | ORAL | Status: DC

## 2015-05-11 NOTE — Patient Instructions (Signed)
It was nice seeing you today. You physical exam is fine. I am sorry about your breast pain, at times this could be related to cyclical hormonal changes. I will recommend that you monitor this for about 4 wk and document pain onset, duration and frequency in a diary for me. Please bring this with you during your next visit. Also your pelvic pain could be due to cyclical changes vs ovarian cyst which usually warrants no treatment. If no improvement in 4 wks we will obtain an U/S. In the mean time if any of your symptoms worsens please see me back sooner rather than later.  Breast Tenderness Breast tenderness is a common problem for women of all ages. Breast tenderness may cause mild discomfort to severe pain. It has a variety of causes. Your health care provider will find out the likely cause of your breast tenderness by examining your breasts, asking you about symptoms, and ordering some tests. Breast tenderness usually does not mean you have breast cancer. HOME CARE INSTRUCTIONS  Breast tenderness often can be handled at home. You can try:  Getting fitted for a new bra that provides more support, especially during exercise.  Wearing a more supportive bra or sports bra while sleeping when your breasts are very tender.  If you have a breast injury, apply ice to the area:  Put ice in a plastic bag.  Place a towel between your skin and the bag.  Leave the ice on for 20 minutes, 2-3 times a day.  If your breasts are too full of milk as a result of breastfeeding, try:  Expressing milk either by hand or with a breast pump.  Applying a warm compress to the breasts for relief.  Taking over-the-counter pain relievers, if approved by your health care provider.  Taking other medicines that your health care provider prescribes. These may include antibiotic medicines or birth control pills. Over the long term, your breast tenderness might be eased if you:  Cut down on caffeine.  Reduce the amount  of fat in your diet. Keep a log of the days and times when your breasts are most tender. This will help you and your health care provider find the cause of the tenderness and how to relieve it. Also, learn how to do breast exams at home. This will help you notice if you have an unusual growth or lump that could cause tenderness. SEEK MEDICAL CARE IF:   Any part of your breast is hard, red, and hot to the touch. This could be a sign of infection.  Fluid is coming out of your nipples (and you are not breastfeeding). Especially watch for blood or pus.  You have a fever as well as breast tenderness.  You have a new or painful lump in your breast that remains after your menstrual period ends.  You have tried to take care of the pain at home, but it has not gone away.  Your breast pain is getting worse, or the pain is making it hard to do the things you usually do during your day.   This information is not intended to replace advice given to you by your health care provider. Make sure you discuss any questions you have with your health care provider.   Document Released: 03/09/2008 Document Revised: 11/27/2012 Document Reviewed: 10/24/2012 Elsevier Interactive Patient Education [employer]

## 2015-05-11 NOTE — Telephone Encounter (Signed)
Patient contacted and I discussed result with her. She is aware of medication at the pharmacy.   Note two metronidazole prescription was e-prescribed. I personally contacted her pharmacy and canceled one order.

## 2015-05-11 NOTE — Telephone Encounter (Signed)
Message left to call back about result.    Result: Wet prep abnormal.

## 2015-05-11 NOTE — Progress Notes (Addendum)
Patient ID: Julia May, female   DOB: 1980-06-08, 35 y.o.   MRN: [id number] Subjective:     Julia May is a 35 y.o. female and is here for a comprehensive physical exam. The patient reports problems - breast pain x 8 months RLQ x 3 month. 2 wks vaginal itching and discharge with fishy odor.  Patient requesting pelvic exam to test for BV, yeast and STDs.  She states she has been married for 14 years, "but you never can tell."  Patient states RLQ and left breast pain are intermittent.  She denies nipple discharge, but states breast is sometimes sensitive to touch.  Patient denies N/V/D, constipation, changes in bowel habits, fever and unexplained weight loss.    Social History   Social History  . Marital Status: Single    Spouse Name: N/A  . Number of Children: N/A  . Years of Education: N/A   Occupational History  . Not on file.   Social History Main Topics  . Smoking status: Current Every Day Smoker -- 1.00 packs/day for 7 years    Types: Cigarettes  . Smokeless tobacco: Not on file  . Alcohol Use: No     Comment: quit 38 days ago 03/09/11 todays date  . Drug Use: No     Comment: marajuana, cocaine  . Sexual Activity:    Partners: Male     Comment: last month   Other Topics Concern  . Not on file   Social History Narrative   Health Maintenance  Topic Date Due  . INFLUENZA VACCINE  07/09/2015 (Originally 11/09/2014)  . PAP SMEAR  09/20/2015  . TETANUS/TDAP  03/10/2016  . HIV Screening  Completed    The following portions of the patient's history were reviewed and updated as appropriate: allergies, current medications, past family history, past medical history, past social history, past surgical history and problem list.  Review of Systems Pertinent items are noted in HPI.   Objective:    General appearance: alert, cooperative and appears stated age Back: no tenderness to percussion or palpation, symmetric, no curvature. ROM normal. No CVA tenderness. Lungs: clear  to auscultation bilaterally Breasts: normal appearance, no masses or tenderness, No nipple retraction or dimpling, No nipple discharge or bleeding, No axillary or supraclavicular adenopathy, Normal to palpation without dominant masses Heart: regular rate and rhythm, S1, S2 normal, no murmur, click, rub or gallop   Pelvic exam: normal external genitalia, vulva, vagina, cervix, uterus.  Small amount of white discharge noted in os.  ADNEXA: normal adnexa in size, nontender and no masses.    Assessment:    Healthy female exam.       Plan:  Patient to return for follow-up visit in 4 weeks to reassess breast and RLQ pain.  Discussed with patient keeping a journal regarding left breast pain to determine when it occurs, activity that provokes the pain, if any and what treatments did/did not work to alleviate pain. Will consider pelvic and left breast ultrasounds if pain persists after 4 weeks monitoring.   See After Visit Summary for Counseling Recommendations    [facility] ATTENDING  NOTE Oceana Walthall,MD I  have seen and examined this patient, reviewed their chart. I have discussed this patient with the NP student. I agree with the student's findings, assessment and care plan.  Patient here for routine physical exam, she also mentioned she has been having intermittent left breast soreness and right lower quadrant abdominal pain. No other GI symptoms, no other breast  symptoms. She denies family hx of any form of cancer. She also endorsed vaginal discharge today.  Exam: Gen: Awake and alert, oriented x 3. Neuro: Grossly intact. HEENT: EOMI, PERRLA. No facial asymmetry. Bibo/AT. Chest: No breast asymmetry, no palpable mass, mild tenderness of left breast, but no tenderness when distracted. Resp: Air entry equal and CTA B/L. Heart: S1 S2 normal. RRR. No murmur. Abd: Soft, Non distended. Mild RLQ tenderness. GU: ++ vaginal discharge. Cervix looks fine, no cervical motion tenderness, uterus well  positioned. No adnexal fullness. Mirena string visible from cervical os. Ext: No edema.  A/P: Well exam: Normal health adult.                    Age appropriate counseling done.                   Health maintenance reviewed. She is due for PAP in June of this year.                   Gyn exam completed and specimen was taken for wet prep.                    Wet prep + for BV. I tried to contact her by phone. Message left to call back.                   Will send A/B to her pharmacy in the mean time.                   NB: Patient confirmed she got flu shot at work in Oct 2016 [facility]).                    Vaginal discharge: Wet prep + for BV. A/B sent to pharmacy.                                Specimen obtained for GC/Chlamydia. I will contact her with result.                                She had HIv done already and she denies change in partner.  Breast and Pelvic pain: Exam benign.                                       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 Ovarian cyst for pelvic pain.                                       I recommended documentation of pain nature in a diary and to bring it with her during next visit.                                       To consider breast and pelvic  U/S if no improvement.

## 2015-05-11 NOTE — Addendum Note (Signed)
Addended by: Janit Pagan T on: 05/11/2015 09:39 AM   Modules accepted: Orders, Medications

## 2015-05-12 ENCOUNTER — Telehealth: Payer: Self-pay | Admitting: Family Medicine

## 2015-05-12 LAB — CERVICOVAGINAL ANCILLARY ONLY
Chlamydia: NEGATIVE
NEISSERIA GONORRHEA: NEGATIVE

## 2015-05-12 NOTE — Telephone Encounter (Signed)
Neg Gonorrhea and Chlamydia discussed with patient.

## 2015-07-13 ENCOUNTER — Encounter: Payer: Self-pay | Admitting: Family Medicine

## 2015-07-13 ENCOUNTER — Other Ambulatory Visit (HOSPITAL_COMMUNITY)
Admission: RE | Admit: 2015-07-13 | Discharge: 2015-07-13 | Disposition: A | Discharge status: Home / Self Care | Payer: Medicaid Other | Source: Ambulatory Visit | Attending: Family Medicine | Admitting: Family Medicine

## 2015-07-13 ENCOUNTER — Ambulatory Visit (INDEPENDENT_AMBULATORY_CARE_PROVIDER_SITE_OTHER): Payer: Medicaid Other | Admitting: Family Medicine

## 2015-07-13 VITALS — BP 126/92 | HR 79 | Temp 98.2°F | Ht 62.0 in | Wt 120.0 lb

## 2015-07-13 DIAGNOSIS — Z01419 Encounter for gynecological examination (general) (routine) without abnormal findings: Secondary | ICD-10-CM | POA: Insufficient documentation

## 2015-07-13 DIAGNOSIS — Z124 Encounter for screening for malignant neoplasm of cervix: Secondary | ICD-10-CM | POA: Diagnosis not present

## 2015-07-13 DIAGNOSIS — Z1151 Encounter for screening for human papillomavirus (HPV): Secondary | ICD-10-CM | POA: Diagnosis present

## 2015-07-13 DIAGNOSIS — L720 Epidermal cyst: Secondary | ICD-10-CM

## 2015-07-13 DIAGNOSIS — L301 Dyshidrosis [pompholyx]: Secondary | ICD-10-CM

## 2015-07-13 MED ORDER — BETAMETHASONE VALERATE 0.1 % EX CREA: TOPICAL_CREAM | Freq: Two times a day (BID) | CUTANEOUS | Status: DC

## 2015-07-13 NOTE — Progress Notes (Signed)
Patient ID: Julia May, female   DOB: 09/26/1980, 35 y.o.   MRN: [id number] Subjective:   Gyne Exam:  Julia May is a 35 y.o. female here for a routine exam.  Current complaints: no GU complaint.  Personal health questionnaire reviewed: no.   Gynecologic History No LMP recorded. Patient is not currently having periods (Reason: IUD). Contraception: Mirena Last Pap: June 2014. Results were: normal Last mammogram: N/A. Results were: N/A  Cyst: C/O cyst on her back which is getting bigger, it has been there for 5 yrs. It gets painful when she touches it at times. The center of the cyst is starting to change in color to bluish black. Trigger unknown. It started like a small pimple and gradually got bigger over time but now it stopped growing. Rash: Rash on her feet B/L, on going for 2 months. It started like a small bump and now it is spreading. The bottom of her feet gets painful at times. She usually have some vesicle the pops on the skin of her feet. She changes skin lotion frequently but does not feel it is related to her lesion since she does not have similar rash on other parts of her body. She usually do feet soap bathing as often as possible and at times she will play in water.  Current Outpatient Prescriptions on File Prior to Visit  Medication Sig Dispense Refill  . levonorgestrel (MIRENA) 20 MCG/24HR IUD 1 each by Intrauterine route once. 1 each 0  . albuterol (PROVENTIL HFA;VENTOLIN HFA) 108 (90 BASE) MCG/ACT inhaler Inhale 2 puffs into the lungs every 6 (six) hours as needed for wheezing. (Patient not taking: Reported on 11/06/2014) 1 Inhaler 1   No current facility-administered medications on file prior to visit.   History reviewed. No pertinent past medical history.   Obstetric History OB History  No data available     The following portions of the patient's history were reviewed and updated as appropriate: allergies, current medications, past family history, past  medical history, past social history, past surgical history and problem list.  Review of Systems Pertinent items are noted in HPI.    Objective:    BP 126/92 mmHg  Pulse 79  Temp(Src) 98.2 F (36.8 C) (Oral)  Ht 5\' 2"  (1.575 m)  Wt 120 lb (54.432 kg)  BMI 21.94 kg/m2 General appearance: alert, cooperative and appears stated age Back: symmetric, no curvature. ROM normal. No CVA tenderness. Lungs: clear to auscultation bilaterally Heart: regular rate and rhythm, S1, S2 normal, no murmur, click, rub or gallop Abdomen: soft, non-tender; bowel sounds normal; no masses,  no organomegaly Pelvic: cervix normal in appearance, external genitalia normal, no adnexal masses or tenderness, no cervical motion tenderness, rectovaginal septum normal, uterus normal size, shape, and consistency and vagina normal without discharge Extremities: extremities normal, atraumatic, no cyanosis or edema Skin: Normal skin color, raised, round, non-tender, non-erythematous cyston her back measuring 3cm by 3cm circumferencially. Small pinpoint opening at the center with fould smelling dead skin tissue   Dry, scaly, peeling maculo-papular lesion on her feet B/L, mostly on the lateral foot border.    Assessment:   Gyne exam Epidermoid cyst Skin rash :Dyshidrotic Eczema Plan:   1. PAP completed. Gyn exam otherwise normal. I will contact her with result. 2. I recommended removal. She is instructed to schedule follow up at the derm clinic in 1-2 wks. 3. Betamethasone prescribed. I recommended she avoids dipping her foot in water for now. F/U soon if no  improvement.

## 2015-07-13 NOTE — Patient Instructions (Signed)
It was nice seeing you to day. It seems you have dyshydrotic eczema. Please avoid dipping your feet in water. Use topical steroid as prescribed. See me soon if no improvement. You also have epidermoid cyst on you back please schedule dermatology appointment here for removal. We did your PAP today. I will call yo soon with results.

## 2015-07-15 ENCOUNTER — Telehealth: Payer: Self-pay | Admitting: Family Medicine

## 2015-07-15 LAB — CYTOLOGY - PAP

## 2015-07-15 NOTE — Telephone Encounter (Signed)
Call back about test result   In case she calls back please let her know that her PAP test is normal. Repeat PAP in 3-5 yrs.

## 2015-08-18 ENCOUNTER — Ambulatory Visit (INDEPENDENT_AMBULATORY_CARE_PROVIDER_SITE_OTHER): Payer: Medicaid Other | Admitting: Family Medicine

## 2015-08-18 VITALS — BP 131/91 | HR 88 | Temp 98.7°F | Ht 62.0 in | Wt 125.0 lb

## 2015-08-18 DIAGNOSIS — L720 Epidermal cyst: Secondary | ICD-10-CM | POA: Diagnosis not present

## 2015-08-18 NOTE — Progress Notes (Signed)
Patient ID: Blenda MountsLatia M Blixt, female   DOB: 12/31/1980, 35 y.o.   MRN: [id number] Epidermoid Cyst Excision Procedure Note  Pre-operative Diagnosis: Epidermal cyst  Post-operative Diagnosis: same  Locations:middle, thoracic area  mid back.   Measuring 1.5 by 2cm circumferencially      Indications: Getting irritated and bigger  Anesthesia: Lidocaine 1% with epinephrine without added sodium bicarbonate  Procedure Details  History of allergy to iodine: no  Patient informed of the risks (including bleeding and infection) and benefits of the  procedure and both verbal and written informed consent obtained.  The lesion and surrounding area was given a sterile prep using betadyne and draped in the usual sterile fashion. An incision was made over the cyst, which was dissected free of the surrounding tissue and removed.  The cyst was filled with typical epidermoid material with dead skin tissue.  The wound was closed with 3-0 Nylon using simple interrupted stitches. Antibiotic ointment and a sterile dressing applied.  The specimen was not sent for pathologic examination. The patient tolerated the procedure well.  EBL: 0.5 ml approximately  Findings: Encapsulated cyst removed in it's entirety.  Condition: Stable  Complications: none.  Plan: 1. Instructed to keep the wound dry and covered for 24-48h and clean thereafter. 2. Warning signs of infection were reviewed.   3. Recommended that the patient use OTC acetaminophen as needed for pain.  4. Return for suture removal in 10 days.

## 2015-08-18 NOTE — Patient Instructions (Signed)
It was nice seeing you today. I removed your cyst without any complication. Three sutures was placed. Please come back in 10 days for suture removal.  Incision Care  An incision (cut) is when a surgeon cuts into your body. After surgery, the cut needs to be well cared for to keep it from getting infected.  HOW TO CARE FOR YOUR CUT  Take medicines only as told by your doctor.  There are many different ways to close and cover a cut, including stitches, skin glue, and adhesive strips. Follow your doctor's instructions on:  Care of the cut.  Bandage (dressing) changes and removal.  Cut closure removal.  Do not take baths, swim, or use a hot tub until your doctor says it is okay. You may shower as told by your doctor.  Return to your normal diet and activities as allowed by your doctor.  Use medicine that helps lessen itching on your cut as told by your doctor. Do not pick or scratch at your cut.  Drink enough fluids to keep your pee (urine) clear or pale yellow. GET HELP IF:  You have redness, puffiness (swelling), or pain at the site of your cut.  You have fluid, blood, or pus coming from your cut.  Your muscles ache.  You have chills or you feel sick.  You have a bad smell coming from the cut or bandage.  Your cut opens up after stitches, staples, or adhesive strips have been removed.  You keep feeling sick to your stomach (nauseous) or keep throwing up (vomiting).  You have a fever.  You are dizzy. GET HELP RIGHT AWAY IF:  You have a rash.  You pass out (faint).  You have trouble breathing. MAKE SURE YOU:   Understand these instructions.  Will watch your condition.  Will get help right away if you are not doing well or get worse.   This information is not intended to replace advice given to you by your health care provider. Make sure you discuss any questions you have with your health care provider.   Document Released: 06/19/2011 Document Revised: 04/17/2014  Document Reviewed: 05/21/2013 Elsevier Interactive Patient Education [employer] Else[employer] Inc[employer]

## 2015-08-27 ENCOUNTER — Ambulatory Visit (INDEPENDENT_AMBULATORY_CARE_PROVIDER_SITE_OTHER): Payer: Medicaid Other | Admitting: *Deleted

## 2015-08-27 ENCOUNTER — Ambulatory Visit: Payer: Medicaid Other | Admitting: Family Medicine

## 2015-08-27 DIAGNOSIS — Z4802 Encounter for removal of sutures: Secondary | ICD-10-CM

## 2015-08-27 NOTE — Progress Notes (Signed)
Patient in today for suture removal. Precepted by Dr. Lum BabeEniola, three sutures removed from mid-back area. Wound healing well without signs of infection. Patient given sample packs of bacitracin ointment to apply to wound for the next few days.

## 2015-10-06 ENCOUNTER — Ambulatory Visit (INDEPENDENT_AMBULATORY_CARE_PROVIDER_SITE_OTHER): Payer: Medicaid Other | Admitting: Family Medicine

## 2015-10-06 ENCOUNTER — Encounter: Payer: Self-pay | Admitting: Family Medicine

## 2015-10-06 VITALS — BP 120/77 | HR 74 | Temp 98.1°F | Wt 122.4 lb

## 2015-10-06 DIAGNOSIS — H6093 Unspecified otitis externa, bilateral: Secondary | ICD-10-CM | POA: Diagnosis not present

## 2015-10-06 DIAGNOSIS — H60503 Unspecified acute noninfective otitis externa, bilateral: Secondary | ICD-10-CM | POA: Insufficient documentation

## 2015-10-06 MED ORDER — CIPROFLOXACIN-DEXAMETHASONE 0.3-0.1 % OT SUSP: 4.0000 [drp] | Freq: Two times a day (BID) | OTIC | Status: DC

## 2015-10-06 NOTE — Progress Notes (Signed)
Subjective:    Patient ID: Julia MountsLatia M Mitten, female    DOB: 03/08/1981, 35 y.o.   MRN: [id number]  Julia May is a 35 y.o. female presenting on 10/06/2015 for Otitis Media   Patient presents for a same day appointment.   HPI  BILATERAL (L >R) OTITIS EXTERNA: - Reports current flare with Left ear pain and loss of hearing, feels "off balance" and some associated dizziness at times (worse in morning), gradual worsening over past 4 days. Reports that pain will be intermittent worse with "wind" or pressure at times during the day over her tragus, significantly worse at night especially if lay on that side, also she admits to some clear drainage that will come out of her ear and she will "pick" at the crusting some, which irritates it more. - Chronic recurrent problems similar to this with recurrent Left otitis externa,  - Denies any swimming or submerging head underwater - Currently works as Engineer, civil (consulting)nurse and it affects her using stethoscope - Admits persistent muffled hearing in Left ear, some nausea without vomiting, occasional headache - Denies any fevers/chills, sweats, sinus pressure, swollen lymph nodes, sore throat, cough / congestion  Social History  Substance Use Topics  . Smoking status: Current Every Day Smoker -- 0.50 packs/day for 7 years    Types: Cigarettes  . Smokeless tobacco: None  . Alcohol Use: No     Comment: quit 38 days ago 03/09/11 todays date    Review of Systems Per HPI unless specifically indicated above     Objective:    BP 120/77 mmHg  Pulse 74  Temp(Src) 98.1 F (36.7 C) (Oral)  Wt 122 lb 5.8 oz (55.502 kg)  SpO2 100%  Wt Readings from Last 3 Encounters:  10/06/15 122 lb 5.8 oz (55.502 kg)  08/18/15 125 lb (56.7 kg)  07/13/15 120 lb (54.432 kg)    Physical Exam  Constitutional: She appears well-developed and well-nourished. No distress.  Well-appearing non-toxic, slightly uncomfortable with Left ear discomfort and some muffled hearing on Left,  cooperative  HENT:  Head: Normocephalic and atraumatic.  Mouth/Throat: Oropharynx is clear and moist.  Left Ear External ear pinna is normal, mild tenderness with direct palpation of tragus and external ear. No tenderness or erythema over mastoid process or surrounding skin. Left ear canal with notable debris and crusting discharge without ulceration erythema. TM partially visible without erythema limited view but no obvious fluid or effusion behind TM.  Right Ear External ear pinna is normal, no tenderness with direct palpation of tragus and external ear. No tenderness or erythema over mastoid process or surrounding skin. Right ear canal with lesser amount of debris and without ulceration erythema. TM visible without erythema or effusion.  Eyes: Conjunctivae are normal. Right eye exhibits no discharge. Left eye exhibits no discharge.  Neck: Normal range of motion. Neck supple.  Cardiovascular: Normal rate and intact distal pulses.   Pulmonary/Chest: Effort normal.  Lymphadenopathy:    She has no cervical adenopathy.  Neurological: She is alert.  Skin: Skin is warm and dry. She is not diaphoretic.  Nursing note and vitals reviewed.      Assessment & Plan:   Problem List Items Addressed This Visit    Acute otitis externa of both ears - Primary    Left significantly worse compared to Right (early) otitis externa, acute on chronic with multiple recurrences (unclear trigger, not swimmer) over past few years, last flare 03/2015. Consistent with tragus/external pain and drainage with debris, no obvious  AOM present bilaterally. No extending cellulitis or ulceration. No evidence of mastoiditis or other complication. No immune compromise. Afebrile and well appearing otherwise.  Plan: 1. Treat with CiproDex otic drops 4 drops in each ear (start with Left, then include R-ear if worsening) BID x 7 days as instructed 2. Offered ear debris clean out today (ideally would improve healing), patient  declined due to discomfort with cleaning 3. May use rolled kleenex as temporary wick, otherwise no q-tips avoid scratching 4. Tylenol / Ibuprofen PRN pain 5. Referral to ENT placed for further evaluation and discussion on future prevention, may need more advanced ear debris clean out as well.      Relevant Medications   ciprofloxacin-dexamethasone (CIPRODEX) otic suspension   Other Relevant Orders   Ambulatory referral to ENT      Meds ordered this encounter  Medications  . ciprofloxacin-dexamethasone (CIPRODEX) otic suspension    Sig: Place 4 drops into both ears 2 (two) times daily.    Dispense:  7.5 mL    Refill:  0      Follow up plan: Return in about 2 weeks (around 10/20/2015), or if symptoms worsen or fail to improve, for bilateral otitis externa.  Saralyn PilarAlexander Karamalegos, DO [facility], PGY-3

## 2015-10-06 NOTE — Patient Instructions (Signed)
Thank you for coming in to clinic today.  You have another episode of otitis externa, which is infection of ear canal. Sometimes this affects the inner ear or ear drum, but I do not see this complication today.  Treatment is Ciprodex (topical antibiotic / steroid ear drops), 4 drops per dose, 2 times a day for 7 days, ideally you could use this in both ears, but if you want to start only with Left ear that is the priority. Same as before. Lay on your other side allow drops to sit in ear for 10-1415min.  Do not put anything into ear including Q-tips or any liquids. The drainage will come out eventually. You may try using a rolled up kleenex as a wick, to soak up some debris.  AVOID swimming or submerging ear. Try to avoid getting any water inside ear canal (especially during showering).  You may take Motrin and or Tylenol every 6 hours as needed for pain.  If you develop significant pain, loss of hearing, fevers/chills, spreading redness on skin or new symptoms, please return sooner.  Referral placed to ENT for future evaluation and prevention.  Follow-up with Dr Lum BabeEniola in 7 to 10 days to re-check ear otitis externa infection - IF NOT improving, we may need to clean out your ears  If you have any other questions or concerns, please feel free to call the clinic to contact me. You may also schedule an earlier appointment if necessary.  However, if your symptoms get significantly worse, please go to the Emergency Department to seek immediate medical attention.  Saralyn PilarAlexander Harley Fitzwater, DO [facility]

## 2015-10-06 NOTE — Assessment & Plan Note (Signed)
Left significantly worse compared to Right (early) otitis externa, acute on chronic with multiple recurrences (unclear trigger, not swimmer) over past few years, last flare 03/2015. Consistent with tragus/external pain and drainage with debris, no obvious AOM present bilaterally. No extending cellulitis or ulceration. No evidence of mastoiditis or other complication. No immune compromise. Afebrile and well appearing otherwise.  Plan: 1. Treat with CiproDex otic drops 4 drops in each ear (start with Left, then include R-ear if worsening) BID x 7 days as instructed 2. Offered ear debris clean out today (ideally would improve healing), patient declined due to discomfort with cleaning 3. May use rolled kleenex as temporary wick, otherwise no q-tips avoid scratching 4. Tylenol / Ibuprofen PRN pain 5. Referral to ENT placed for further evaluation and discussion on future prevention, may need more advanced ear debris clean out as well.

## 2015-10-18 ENCOUNTER — Encounter: Payer: Self-pay | Admitting: Internal Medicine

## 2015-10-18 ENCOUNTER — Ambulatory Visit (INDEPENDENT_AMBULATORY_CARE_PROVIDER_SITE_OTHER): Payer: Self-pay | Admitting: Internal Medicine

## 2015-10-18 VITALS — BP 114/82 | HR 73 | Temp 98.3°F | Ht 62.0 in | Wt 125.0 lb

## 2015-10-18 DIAGNOSIS — H60503 Unspecified acute noninfective otitis externa, bilateral: Secondary | ICD-10-CM

## 2015-10-18 DIAGNOSIS — H6093 Unspecified otitis externa, bilateral: Secondary | ICD-10-CM

## 2015-10-18 NOTE — Progress Notes (Signed)
   Subjective:    Julia May - 35 y.o. female MRN [id number]  Date of birth: 03/18/1981  HPI  Julia May is 35 y.o. female with PMH of recurrent otitis externa here for SDA for ear fullness.  Ear Fullness: Left greater than right. Reports she [facility] complaint. However, now does not have pain in ears just sensation of fullness and feeling off balanced. Also feels like she can't hear out og her left ear. Has been using Ciprodex very infrequently. Had some drainage over a week ago. Denies fevers, chills, sinus pressure, sore throat, rhinorrhea, and cough. Denies swimming or submerging head underwater.   -  reports that she has been smoking Cigarettes.  She has a 3.5 pack-year smoking history. She does not have any smokeless tobacco history on file. - Review of Systems: Per HPI. - Past Medical History: Patient Active Problem List   Diagnosis Date Noted  . Acute otitis externa of both ears 10/06/2015  . Dermatitis 11/06/2014  . Alcoholism /alcohol abuse (HCC) 02/08/2011  . ANEMIA, IRON DEFICIENCY, UNSPEC. 06/07/2006  . TOBACCO DEPENDENCE 06/07/2006   - Medications: reviewed and updated    Objective:   Physical Exam BP 114/82 mmHg  Pulse 73  Temp(Src) 98.3 F (36.8 C) (Oral)  Ht 5\' 2"  (1.575 m)  Wt 125 lb (56.7 kg)  BMI 22.86 kg/m2 Gen: NAD, alert, cooperative with exam, well-appearing HEENT: Head: Normocephalic and atraumatic.   Left ear: Mild tenderness with direct palpation of tragus and external ear. Large amount of cerumen and unable to visualize TM. Cerumen removed and TM with some dried blood but otherwise non-erythematous/non-bulging. Ear canal with crusting discharge without ulceration.  Right ear: Mild tenderness with direct palpation of tragus and external ear. Large amount of cerumen and unable to visualize TM. Cerumen removed and TM normal without erythema and non-bulging. Erythema of ear canal noted without ulceration.    Mouth/throat: Oropharynx clear. MMM.  Eyes: Conjunctivae are normal and eyes without discharge. EOMI.  Neck: Normal range of motion. Supple. Without appreciable cervical LAD.      Assessment & Plan:   Acute otitis externa of both ears Both ears with signs of otitis externa, acute on chronic with multiple recurrences (unclear trigger, not swimmer) over past few years. Consistent with tragus/external pain and drainage with debris, no obvious AOM present bilaterally. No extending cellulitis or ulceration. No evidence of mastoiditis or other complication. Afebrile and well appearing otherwise. Suspect patient has been unable to treat otitis externa since last OV given large amount of cerumen unable to be penetrated by Ciprodex. Patient passed hearing screen after removal of cerumen.  -continue with Ciprodex otic drops bilaterally  -patient to return on 7/25 for financial appointment to try to facilitate ENT referral -follow up with PCP as needed     Marcy Sirenatherine Netasha Wehrli, D.O. 10/18/2015, 5:29 PM PGY-2, [facility]

## 2015-10-18 NOTE — Patient Instructions (Signed)
Use your Ciprodex ear drops 4 drops each ear twice a day.   We will try to figure out how to get you in with the ENT during your appointment on 7/25 at the clinic.   Take Care,   Dr Earlene PlaterWallace

## 2015-10-18 NOTE — Assessment & Plan Note (Signed)
Both ears with signs of otitis externa, acute on chronic with multiple recurrences (unclear trigger, not swimmer) over past few years. Consistent with tragus/external pain and drainage with debris, no obvious AOM present bilaterally. No extending cellulitis or ulceration. No evidence of mastoiditis or other complication. Afebrile and well appearing otherwise. Suspect patient has been unable to treat otitis externa since last OV given large amount of cerumen unable to be penetrated by Ciprodex. Patient passed hearing screen after removal of cerumen.  -continue with Ciprodex otic drops bilaterally  -patient to return on 7/25 for financial appointment to try to facilitate ENT referral -follow up with PCP as needed

## 2015-11-30 ENCOUNTER — Encounter: Payer: Self-pay | Admitting: Family Medicine

## 2015-11-30 ENCOUNTER — Encounter (HOSPITAL_COMMUNITY): Payer: Self-pay

## 2015-11-30 ENCOUNTER — Telehealth: Payer: Self-pay | Admitting: Family Medicine

## 2015-11-30 ENCOUNTER — Emergency Department (HOSPITAL_COMMUNITY)
Admission: EM | Admit: 2015-11-30 | Discharge: 2015-11-30 | Disposition: A | Discharge status: Home / Self Care | Payer: Medicaid Other | Attending: Dermatology | Admitting: Dermatology

## 2015-11-30 ENCOUNTER — Ambulatory Visit (INDEPENDENT_AMBULATORY_CARE_PROVIDER_SITE_OTHER): Payer: Self-pay | Admitting: Family Medicine

## 2015-11-30 VITALS — BP 138/90 | HR 90 | Temp 98.0°F | Ht 62.0 in | Wt 119.2 lb

## 2015-11-30 DIAGNOSIS — H538 Other visual disturbances: Secondary | ICD-10-CM | POA: Insufficient documentation

## 2015-11-30 DIAGNOSIS — Z5321 Procedure and treatment not carried out due to patient leaving prior to being seen by health care provider: Secondary | ICD-10-CM | POA: Insufficient documentation

## 2015-11-30 DIAGNOSIS — R42 Dizziness and giddiness: Secondary | ICD-10-CM

## 2015-11-30 HISTORY — DX: Other visual disturbances: H53.8

## 2015-11-30 LAB — POCT HEMOGLOBIN: HEMOGLOBIN: 13 g/dL (ref 12.2–16.2)

## 2015-11-30 NOTE — Telephone Encounter (Signed)
Pt decided to leave the ED. She was told it was a 2 hr wait and she has to register her daughter for school.  She said she was feeling better. She just wanted Dr Caroleen Hammanumley to know

## 2015-11-30 NOTE — ED Triage Notes (Signed)
Pt see at family medicine this morning for onset upon awakening dizziness, right eye pressure, right eye blurred vision.  Had ears irrigated at office and now pt is c/o no dizziness, eye pressure and blurred vision is decreasing.  No ear pain.

## 2015-11-30 NOTE — ED Triage Notes (Signed)
Pt left waiting room. RN, NT called 3 separate times for vital signs. Pt will be discharged at this time.

## 2015-11-30 NOTE — Progress Notes (Signed)
Subjective:     Patient ID: Julia May, female   DOB: 03/28/1981, 35 y.o.   MRN: [id number]  HPI Julia May is a 35 year old female presenting today for blurred vision, right eye pressure. -Reports symptoms of blurred vision in right eye, increased right eye pressure, and floaters in the right eye. Also notes dizziness with occasional difficulty in ambulating. Feels like she is leaning towards the right side. -Denies eye pain, headache, weakness, numbness -Reports decreased energy for the last 2 weeks. -Notes history of anemia. Last CBC documented in our offices in 2012. -First noted symptoms when she woke up this morning. Last normal last night prior to bed. -Denies any history of prior symptoms similar to this. -Denies any history of high problems. Does not wear contacts or glasses. -Denies changes in hearing. Denies ear pain or ear fullness noted at last office visit. -Smokes one pack per 2-3 days. -Mirena for birth control  Review of Systems Per HPI. Other systems negative.    Objective:   Physical Exam  Constitutional: She appears well-developed and well-nourished. No distress.  HENT:  Effusion of right ear noted. Erythema of right ear canal noted, unsure if due to irrigation or infection at this time.  Cardiovascular: Normal rate and regular rhythm.   No murmur heard. Pulmonary/Chest: Effort normal. No respiratory distress. She has no wheezes.  Musculoskeletal: She exhibits no edema.  Gait with occasional loss of balance, Muscle Strength 5/5 in upper and lower extremity.  Neurological: No cranial nerve deficit.  Sensation intact in upper and lower extremities.   Visual Acuity: Distance vision with 20/20 vision in both eyes, left eye with 20/25, right eye with 20/20. Near vision with 20/20 in both eyes, right eye and left eye. In room was noted to have difficulty reading white print surrounded by red background, otherwise was able to read posterior in room.     Assessment  and Plan:     Blurred vision, right eye -Presenting with blurred vision in right eye per patient, right eye pressure, and increased dizziness. Several etiologies considered. Dizziness further evaluated with CBC which showed hemoglobin of 13. Ears also irrigated to further evaluate otic etiologies for increased dizziness and pressure-tympanic membrane on right with effusion noted. Visual acuity normal. Suspect symptoms may be related to the eye itself or possibly neurological in etiology. -Discussed that neurological causes of her pain symptoms cannot be excluded. Discussed the importance of imaging to rule out causes such as stroke. -Patient agreeable to going to emergency department. Need for imaging and possible ophthalmology consult/referral anticipated. Nursing wheeled patient to emergency department.  Update: Patient left the emergency department, refusing to wait in the waiting room. Patient aware of risk.

## 2015-11-30 NOTE — ED Notes (Signed)
Called pt multiple times to obtain vitals. Unable to do so.

## 2015-11-30 NOTE — Patient Instructions (Signed)
Please go to the Emergency Department to further evaluate your eye pressure, floaters, and blurred vision.

## 2015-11-30 NOTE — Assessment & Plan Note (Signed)
-  Presenting with blurred vision in right eye per patient, right eye pressure, and increased dizziness. Several etiologies considered. Dizziness further evaluated with CBC which showed hemoglobin of 13. Ears also irrigated to further evaluate otic etiologies for increased dizziness and pressure-tympanic membrane on right with effusion noted. Visual acuity normal. Suspect symptoms may be related to the eye itself or possibly neurological in etiology. -Discussed that neurological causes of her pain symptoms cannot be excluded. Discussed the importance of imaging to rule out causes such as stroke. -Patient agreeable to going to emergency department. Need for imaging and possible ophthalmology consult/referral anticipated. Nursing wheeled patient to emergency department.  Update: Patient left the emergency department, refusing to wait in the waiting room. Patient aware of risk.

## 2015-12-01 NOTE — Telephone Encounter (Signed)
Will forward to MD to make aware. Jazmin Hartsell,CMA  

## 2015-12-27 ENCOUNTER — Other Ambulatory Visit: Payer: Self-pay | Admitting: *Deleted

## 2015-12-27 MED ORDER — ALBUTEROL SULFATE HFA 108 (90 BASE) MCG/ACT IN AERS: 2.0000 | INHALATION_SPRAY | Freq: Four times a day (QID) | RESPIRATORY_TRACT | 1 refills | Status: DC | PRN

## 2015-12-27 NOTE — Telephone Encounter (Signed)
Patient called and states that she has recently moved and has been unable to find her rescue inhaler.  She has been feeling some tightness and coughing a lot.  Advised patient that she would need an appointment if inhaler doesn't give her relief in the next day or so.  Will send inhaler over to her pharmacy and inform MD since she is out this week. Owais Pruett,CMA

## 2016-01-07 ENCOUNTER — Telehealth: Payer: Self-pay

## 2016-01-07 DIAGNOSIS — R0602 Shortness of breath: Secondary | ICD-10-CM | POA: Insufficient documentation

## 2016-01-07 DIAGNOSIS — R0789 Other chest pain: Secondary | ICD-10-CM | POA: Insufficient documentation

## 2016-01-07 NOTE — Telephone Encounter (Signed)
Patient states that her inhaler is not longer covered by Medicaid. Needs something called in ASAP. Please advise. Please call patient back.

## 2016-01-07 NOTE — Telephone Encounter (Signed)
Patient is aware of PA being worked on and also made an appt with Dr. Raymondo BandKoval. Julia May,CMA

## 2016-01-07 NOTE — Telephone Encounter (Signed)
I reviewed medicaid preferred drug list for 2017, proventil and proair are on it. She is currently on proventil hence it is unclear why it was not covered.  I contacted her pharmacy and they told me they ran both proventil and proair through her insurance, both were denied. I asked for them to send me prior authorization form. I will complete this as soon as I get it.

## 2016-01-07 NOTE — Telephone Encounter (Signed)
Will forward to MD to advise. Ashby Leflore,CMA  

## 2016-01-07 NOTE — Telephone Encounter (Signed)
Upon reviewing her record, I realize she does not have an official diagnosis of asthma and that is likely the reason for the decline.  In 2016, I informed her about this and recommended PFT but she never followed up. I called her and she stated she is now having asthma symptoms.  I will complete prior auth form with diagnosis of asthma/SOB and chest pressure symptoms pending PFT eval.  She agreed to schedule appointment.  Blue team please help her schedule appointment with Dr. Raymondo BandKoval for PFT.

## 2016-01-07 NOTE — Telephone Encounter (Signed)
Called and spoke with [employer] about patient's prior authorization for Proventil HFA.  Prior authorization not required.  Rx is being denied due to patient having only [employer] Medicaid.  Pharmacy informed and patient will have to pay out-of-pocket.  Rx changed to Proair HFA since it is cheaper per pharmacy.  Patient informed and states she will pay for med.  Altamese Dilling~Jeannette Richardson, BSN, RN-BC

## 2016-01-18 ENCOUNTER — Encounter: Payer: Self-pay | Admitting: *Deleted

## 2016-01-18 ENCOUNTER — Ambulatory Visit: Payer: Medicaid Other | Admitting: Pharmacist

## 2016-03-09 ENCOUNTER — Ambulatory Visit: Payer: Medicaid Other | Admitting: Internal Medicine

## 2016-03-14 ENCOUNTER — Ambulatory Visit (INDEPENDENT_AMBULATORY_CARE_PROVIDER_SITE_OTHER): Payer: Self-pay | Admitting: Obstetrics and Gynecology

## 2016-03-14 ENCOUNTER — Encounter: Payer: Self-pay | Admitting: Obstetrics and Gynecology

## 2016-03-14 VITALS — BP 118/78 | HR 73 | Temp 98.5°F | Wt 122.0 lb

## 2016-03-14 DIAGNOSIS — M5432 Sciatica, left side: Secondary | ICD-10-CM

## 2016-03-14 MED ORDER — PREDNISONE 20 MG PO TABS: 20.0000 mg | ORAL_TABLET | Freq: Every day | ORAL | 0 refills | Status: DC

## 2016-03-14 MED ORDER — TRAMADOL HCL 50 MG PO TABS: 50.0000 mg | ORAL_TABLET | Freq: Four times a day (QID) | ORAL | 0 refills | Status: DC | PRN

## 2016-03-14 NOTE — Patient Instructions (Signed)
Sciatica Sciatica is pain, numbness, weakness, or tingling along the path of the sciatic nerve. The sciatic nerve starts in the lower back and runs down the back of each leg. The nerve controls the muscles in the lower leg and in the back of the knee. It also provides feeling (sensation) to the back of the thigh, the lower leg, and the sole of the foot. Sciatica is a symptom of another medical condition that pinches or puts pressure on the sciatic nerve. Generally, sciatica only affects one side of the body. Sciatica usually goes away on its own or with treatment. In some cases, sciatica may keep coming back (recur). What are the causes? This condition is caused by pressure on the sciatic nerve, or pinching of the sciatic nerve. This may be the result of:  A disk in between the bones of the spine (vertebrae) bulging out too far (herniated disk).  Age-related changes in the spinal disks (degenerative disk disease).  A pain disorder that affects a muscle in the buttock (piriformis syndrome).  Extra bone growth (bone spur) near the sciatic nerve.  An injury or break (fracture) of the pelvis.  Pregnancy.  Tumor (rare). What increases the risk? The following factors may make you more likely to develop this condition:  Playing sports that place pressure or stress on the spine, such as football or weight lifting.  Having poor strength and flexibility.  A history of back injury.  A history of back surgery.  Sitting for long periods of time.  Doing activities that involve repetitive bending or lifting.  Obesity. What are the signs or symptoms? Symptoms can vary from mild to very severe, and they may include:  Any of these problems in the lower back, leg, hip, or buttock:  Mild tingling or dull aches.  Burning sensations.  Sharp pains.  Numbness in the back of the calf or the sole of the foot.  Leg weakness.  Severe back pain that makes movement difficult. These symptoms may  get worse when you cough, sneeze, or laugh, or when you sit or stand for long periods of time. Being overweight may also make symptoms worse. In some cases, symptoms may recur over time. How is this diagnosed? This condition may be diagnosed based on:  Your symptoms.  A physical exam. Your health care provider may ask you to do certain movements to check whether those movements trigger your symptoms.  You may have tests, including:  Blood tests.  X-rays.  MRI.  CT scan. How is this treated? In many cases, this condition improves on its own, without any treatment. However, treatment may include:  Reducing or modifying physical activity during periods of pain.  Exercising and stretching to strengthen your abdomen and improve the flexibility of your spine.  Icing and applying heat to the affected area.  Medicines that help:  To relieve pain and swelling.  To relax your muscles.  Injections of medicines that help to relieve pain, irritation, and inflammation around the sciatic nerve (steroids).  Surgery. Follow these instructions at home: Medicines   Take over-the-counter and prescription medicines only as told by your health care provider.  Do not drive or operate heavy machinery while taking prescription pain medicine. Managing pain   If directed, apply ice to the affected area.  Put ice in a plastic bag.  Place a towel between your skin and the bag.  Leave the ice on for 20 minutes, 2-3 times a day.  After icing, apply heat to the   affected area before you exercise or as often as told by your health care provider. Use the heat source that your health care provider recommends, such as a moist heat pack or a heating pad.  Place a towel between your skin and the heat source.  Leave the heat on for 20-30 minutes.  Remove the heat if your skin turns bright red. This is especially important if you are unable to feel pain, heat, or cold. You may have a greater risk of  getting burned. Activity   Return to your normal activities as told by your health care provider. Ask your health care provider what activities are safe for you.  Avoid activities that make your symptoms worse.  Take brief periods of rest throughout the day. Resting in a lying or standing position is usually better than sitting to rest.  When you rest for longer periods, mix in some mild activity or stretching between periods of rest. This will help to prevent stiffness and pain.  Avoid sitting for long periods of time without moving. Get up and move around at least one time each hour.  Exercise and stretch regularly, as told by your health care provider.  Do not lift anything that is heavier than 10 lb (4.5 kg) while you have symptoms of sciatica. When you do not have symptoms, you should still avoid heavy lifting, especially repetitive heavy lifting.  When you lift objects, always use proper lifting technique, which includes:  Bending your knees.  Keeping the load close to your body.  Avoiding twisting. General instructions   Use good posture.  Avoid leaning forward while sitting.  Avoid hunching over while standing.  Maintain a healthy weight. Excess weight puts extra stress on your back and makes it difficult to maintain good posture.  Wear supportive, comfortable shoes. Avoid wearing high heels.  Avoid sleeping on a mattress that is too soft or too hard. A mattress that is firm enough to support your back when you sleep may help to reduce your pain.  Keep all follow-up visits as told by your health care provider. This is important. Contact a health care provider if:  You have pain that wakes you up when you are sleeping.  You have pain that gets worse when you lie down.  Your pain is worse than you have experienced in the past.  Your pain lasts longer than 4 weeks.  You experience unexplained weight loss. Get help right away if:  You lose control of your bowel  or bladder (incontinence).  You have:  Weakness in your lower back, pelvis, buttocks, or legs that gets worse.  Redness or swelling of your back.  A burning sensation when you urinate. This information is not intended to replace advice given to you by your health care provider. Make sure you discuss any questions you have with your health care provider. Document Released: 03/21/2001 Document Revised: 08/31/2015 Document Reviewed: 12/04/2014 Elsevier Interactive Patient Education  2017 [employer]vier Inc.  

## 2016-03-14 NOTE — Progress Notes (Signed)
   Subjective:   Patient ID: Julia May, female    DOB: 01/16/1981, 35 y.o.   MRN: [id number]  Patient presents for Same Day Appointment  Chief Complaint  Patient presents with  . Cough    left sided pain possibly due to coughing     HPI: #BACK PAIN For about a week Thinks it is from coughing so much; no URI symptoms. According to her just a "smoker's cough" Can barely bend over at work Pinching, sharp stabbing pain Located on left side in low back; pain radiates down posterior leg Keeping her up at night Patient has tried using Advil and tylenol like "skittles" without relief No recent illness  History of trauma or injury: no Prior history of similar pain: no  Symptoms Incontinence of bowel or bladder: no Numbness of leg: No Fever: no Rest or Night pain: yes Rash: no Dysuria: no  Vaginal Discharge: No  Review of Systems   See HPI for ROS.   Smoking status - Smoker  Past medical history, surgical, family, and social history reviewed and updated in the EMR as appropriate.  Objective:  BP 118/78   Pulse 73   Temp 98.5 F (36.9 C) (Oral)   Wt 122 lb (55.3 kg)   SpO2 99%   BMI 22.31 kg/m  Vitals and nursing note reviewed  Physical Exam  Constitutional: She appears distressed.  Skin: No rash noted.  Back Exam:  Inspection: Unremarkable  Motion: Flexion 45 deg, Extension 45 deg, Side Bending to 45 deg bilaterally, Rotation to 45 deg bilaterally  Mild pain with extension SLR with resistence: positive on left Palpable tenderness: Yes to sciatic nerve and lumbar spine on left  Leg roll bilaterally: negative  FABER: positive on left Sensory change: Gross sensation intact to all lumbar and sacral dermatomes.  Reflexes: intact Strength 5/5 Antalgic gait    Assessment & Plan:  1. Sciatica of left side. Lumbosacral radiculopathy mainly in the sciatic nerve distrubution on the left. Patient with great distress from pain.  -PO steroid burst to help with  inflammation since NSAIDs tried and failed; also feel appropriate in setting of severity -tramadol prn for pain -work note  -discussed activity modification -handout given -follow-up with PCP in 3-4 weeks  Caryl AdaJazma Sary Bogie, DO 03/14/2016, 11:07 AM PGY-3, [facility]

## 2016-03-29 ENCOUNTER — Encounter: Payer: Self-pay | Admitting: Internal Medicine

## 2016-03-29 ENCOUNTER — Ambulatory Visit (INDEPENDENT_AMBULATORY_CARE_PROVIDER_SITE_OTHER): Payer: Self-pay | Admitting: Internal Medicine

## 2016-03-29 VITALS — BP 120/70 | HR 65 | Temp 98.1°F | Ht 62.0 in | Wt 122.0 lb

## 2016-03-29 DIAGNOSIS — M545 Low back pain, unspecified: Secondary | ICD-10-CM

## 2016-03-29 MED ORDER — OXYCODONE-ACETAMINOPHEN 10-325 MG PO TABS: 1.0000 | ORAL_TABLET | Freq: Three times a day (TID) | ORAL | 0 refills | Status: DC | PRN

## 2016-03-29 MED ORDER — OXYCODONE-ACETAMINOPHEN 5-325 MG PO TABS: 1.0000 | ORAL_TABLET | Freq: Three times a day (TID) | ORAL | 0 refills | Status: AC | PRN

## 2016-03-29 MED ORDER — CYCLOBENZAPRINE HCL 5 MG PO TABS: 5.0000 mg | ORAL_TABLET | Freq: Three times a day (TID) | ORAL | 1 refills | Status: DC | PRN

## 2016-03-29 MED ORDER — OXYCODONE-ACETAMINOPHEN 5-325 MG PO TABS: 1.0000 | ORAL_TABLET | Freq: Three times a day (TID) | ORAL | 0 refills | Status: DC | PRN

## 2016-03-29 NOTE — Patient Instructions (Signed)
Ms. Julia May,  Muscle strain can unfortunately take weeks to get better.  Try to stay active as best you can, as staying in bed can make pain worse.  Try 5 mg of flexeril up to 3 times a day. You can take ibuprofen or tylenol as well. I have prescribed 5 days' of percocet to take as needed to help with being mobile enough for work. I have sent in a prescription for 5 mg tablets of flexeril, as well.   If symptoms do not improve in 4 weeks, please return, as we should consider imaging of your back.  Best, Dr. Sampson GoonFitzgerald

## 2016-03-29 NOTE — Progress Notes (Signed)
[facility] Progress Note  Subjective:  Julia May is a 35 y.o. female who presents for SDA with complaint of continued back pain. She was seen for this 03/14/16 and was given a 5-day steroid burst, which helped. Since completing steroids, however, back pain has continued and gotten a little worse. Sitting makes it worse. It is also worse after waking up in the morning. Yesterday she had to leave work due to pain. 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 The pain started about 2-3 weeks ago. She initially attributed it to a bad cough but today says she just woke up with it and cannot name a trigger. She was prescribed flexeril yesterday and says this helps but made her incredibly sleepy. Tramadol did not help, nor did regular use of ibuprofen or tylenol. She no longer has radiation of pain down her leg. Pain is mostly a twisting, tightness in her left lower back. Patient's main goal is to be comfortable but functional enough to work. ROS: No bladder or bowel incontinence. No numbness.   Chief Complaint  Patient presents with  . Back Pain    No Known Allergies   Social: Current smoker  Objective: Blood pressure 120/70, pulse 65, temperature 98.1 F (36.7 C), temperature source Oral, height 5\' 2"  (1.575 m), weight 122 lb (55.3 kg). Constitutional: Tired appearing female in NAD Pulmonary/Chest: Effort normal and breath sounds normal. No respiratory distress.  Musculoskeletal: TTP over L lumbar paraspinal muscles. No midline spinal tenderness. Decreased rotation and lateral flexion at spine due to pain. Pain into L lower back with hip flexion bilaterally.  Neurological: AOx3, no decreased sensation of extremities Skin: Skin is warm and dry. No rash noted.   Psychiatric: Normal mood and affect.  Vitals reviewed  Assessment/Plan: Back pain - Pain affecting patient's ability to work. No longer with sciatica, so improving from that standpoint. -  Recommend decreasing flexeril dose to 5 mg from 10 mg to reduce sleepiness. - Prescribed percocet 5-325 mg, #15 tablets, explaining this was not a long-term medicine and was being given to help improve mobility so that stiffness does not worsen muscle strain. Advised to use flexeril and ibuprofen first.  - Return if no improvement in 3-4 weeks, as imaging may be warranted at that time  Follow-up prn.  Dani GobbleHillary Elga Santy, MD [facility], PGY-2

## 2016-03-30 DIAGNOSIS — M549 Dorsalgia, unspecified: Secondary | ICD-10-CM | POA: Insufficient documentation

## 2016-03-30 NOTE — Assessment & Plan Note (Signed)
-   Pain affecting patient's ability to work. No longer with sciatica, so improving from that standpoint. - Recommend decreasing flexeril dose to 5 mg from 10 mg to reduce sleepiness. - Prescribed percocet 5-325 mg, #15 tablets, explaining this was not a long-term medicine and was being given to help improve mobility so that stiffness does not worsen muscle strain. Advised to use flexeril and ibuprofen first.  - Return if no improvement in 3-4 weeks, as imaging may be warranted at that time

## 2016-06-11 ENCOUNTER — Emergency Department (HOSPITAL_COMMUNITY)
Admission: EM | Admit: 2016-06-11 | Discharge: 2016-06-11 | Disposition: A | Discharge status: Home / Self Care | Payer: No Typology Code available for payment source | Attending: Emergency Medicine | Admitting: Emergency Medicine

## 2016-06-11 ENCOUNTER — Encounter (HOSPITAL_COMMUNITY): Payer: Self-pay | Admitting: Nurse Practitioner

## 2016-06-11 DIAGNOSIS — Y92410 Unspecified street and highway as the place of occurrence of the external cause: Secondary | ICD-10-CM | POA: Insufficient documentation

## 2016-06-11 DIAGNOSIS — S8991XA Unspecified injury of right lower leg, initial encounter: Secondary | ICD-10-CM | POA: Diagnosis present

## 2016-06-11 DIAGNOSIS — Y999 Unspecified external cause status: Secondary | ICD-10-CM | POA: Insufficient documentation

## 2016-06-11 DIAGNOSIS — S8001XA Contusion of right knee, initial encounter: Secondary | ICD-10-CM | POA: Diagnosis not present

## 2016-06-11 DIAGNOSIS — Y9389 Activity, other specified: Secondary | ICD-10-CM | POA: Diagnosis not present

## 2016-06-11 DIAGNOSIS — F17210 Nicotine dependence, cigarettes, uncomplicated: Secondary | ICD-10-CM | POA: Diagnosis not present

## 2016-06-11 DIAGNOSIS — T07XXXA Unspecified multiple injuries, initial encounter: Secondary | ICD-10-CM

## 2016-06-11 MED ORDER — OXYCODONE-ACETAMINOPHEN 5-325 MG PO TABS
1.0000 | ORAL_TABLET | Freq: Once | ORAL | Status: AC
  Administered 2016-06-11: 1 via ORAL
  Filled 2016-06-11: qty 1

## 2016-06-11 NOTE — Discharge Instructions (Signed)
Use ice on the sore spots for 1 hour, 5 or 6 times a day for 3 days then heat, as needed  For pain, use ibuprofen 400 mg 3 times a day with meals
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
## 2016-06-12 ENCOUNTER — Other Ambulatory Visit: Payer: Self-pay | Admitting: Family Medicine

## 2016-06-12 ENCOUNTER — Telehealth: Payer: Self-pay | Admitting: Family Medicine

## 2016-06-12 MED ORDER — IBUPROFEN 400 MG PO TABS: 400.0000 mg | ORAL_TABLET | Freq: Four times a day (QID) | ORAL | 0 refills | Status: DC | PRN
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
## 2016-06-12 NOTE — Telephone Encounter (Signed)
Ibuprofen has been called in. Please advise patient. Thanks.

## 2016-06-13 NOTE — Telephone Encounter (Signed)
Pt called stating her 400mg  advil was to be called into the pharmacy yesterday. Pt states her pharmacy does not have it. Per [place] note rx was given. I called pharmacy and phoned in rx as they did not have it on file. Advil 400mg  quantity 60 with 0 refills phoned into the [facility] on [address].

## 2016-06-16 ENCOUNTER — Encounter: Payer: Self-pay | Admitting: Family Medicine

## 2016-06-16 ENCOUNTER — Ambulatory Visit (INDEPENDENT_AMBULATORY_CARE_PROVIDER_SITE_OTHER): Payer: Self-pay | Admitting: Family Medicine

## 2016-06-16 VITALS — BP 108/70 | HR 77 | Temp 98.5°F | Wt 130.0 lb

## 2016-06-16 DIAGNOSIS — M25562 Pain in left knee: Secondary | ICD-10-CM

## 2016-06-16 DIAGNOSIS — M542 Cervicalgia: Secondary | ICD-10-CM

## 2016-06-16 DIAGNOSIS — M25561 Pain in right knee: Secondary | ICD-10-CM

## 2016-06-16 DIAGNOSIS — M2550 Pain in unspecified joint: Secondary | ICD-10-CM

## 2016-06-16 DIAGNOSIS — Z3009 Encounter for other general counseling and advice on contraception: Secondary | ICD-10-CM

## 2016-06-16 DIAGNOSIS — M25552 Pain in left hip: Secondary | ICD-10-CM

## 2016-06-16 NOTE — Assessment & Plan Note (Signed)
Chart reviewed today. She has had her IUD in since Oct 2012. Already more than 5 yrs. I advised that she schedule Gyn clinic appointment today for IUD removal. She is advised to use back up in the mean time as she can get pregnant with expired IUD. She verbalized understanding.
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
## 2016-06-16 NOTE — Patient Instructions (Signed)
Knee Exercises Ask your health care provider which exercises are safe for you. Do exercises exactly as told by your health care provider and adjust them as directed. It is normal to feel mild stretching, pulling, tightness, or discomfort as you do these exercises, but you should stop right away if you feel sudden pain or your pain gets worse.Do not begin these exercises until told by your health care provider. STRETCHING AND RANGE OF MOTION EXERCISES  These exercises warm up your muscles and joints and improve the movement and flexibility of your knee. These exercises also help to relieve pain, numbness, and tingling. Exercise A: Knee Extension, Prone  1. Lie on your abdomen on a bed. 2. Place your left / right knee just beyond the edge of the surface so your knee is not on the bed. You can put a towel under your left / right thigh just above your knee for comfort. 3. Relax your leg muscles and allow gravity to straighten your knee. You should feel a stretch behind your left / right knee. 4. Hold this position for __________ seconds. 5. Scoot up so your knee is supported between repetitions. Repeat __________ times. Complete this stretch __________ times a day. Exercise B: Knee Flexion, Active   1. Lie on your back with both knees straight. If this causes back discomfort, bend your left / right knee so your foot is flat on the floor. 2. Slowly slide your left / right heel back toward your buttocks until you feel a gentle stretch in the front of your knee or thigh. 3. Hold this position for __________ seconds. 4. Slowly slide your left / right heel back to the starting position. Repeat __________ times. Complete this exercise __________ times a day. Exercise C: Quadriceps, Prone   1. Lie on your abdomen on a firm surface, such as a bed or padded floor. 2. Bend your left / right knee and hold your ankle. If you cannot reach your ankle or pant leg, loop a belt around your foot and grab the belt  instead. 3. Gently pull your heel toward your buttocks. Your knee should not slide out to the side. You should feel a stretch in the front of your thigh and knee. 4. Hold this position for __________ seconds. Repeat __________ times. Complete this stretch __________ times a day. Exercise D: Hamstring, Supine  1. Lie on your back. 2. Loop a belt or towel over the ball of your left / right foot. The ball of your foot is on the walking surface, right under your toes. 3. Straighten your left / right knee and slowly pull on the belt to raise your leg until you feel a gentle stretch behind your knee.  Do not let your left / right knee bend while you do this.  Keep your other leg flat on the floor. 4. Hold this position for __________ seconds. Repeat __________ times. Complete this stretch __________ times a day. STRENGTHENING EXERCISES  These exercises build strength and endurance in your knee. Endurance is the ability to use your muscles for a long time, even after they get tired. Exercise E: Quadriceps, Isometric   1. Lie on your back with your left / right leg extended and your other knee bent. Put a rolled towel or small pillow under your knee if told by your health care provider. 2. Slowly tense the muscles in the front of your left / right thigh. You should see your kneecap slide up toward your hip or see   increased dimpling just above the knee. This motion will push the back of the knee toward the floor. 3. For __________ seconds, keep the muscle as tight as you can without increasing your pain. 4. Relax the muscles slowly and completely. Repeat __________ times. Complete this exercise __________ times a day. Exercise F: Straight Leg Raises - Quadriceps  1. Lie on your back with your left / right leg extended and your other knee bent. 2. Tense the muscles in the front of your left / right thigh. You should see your kneecap slide up or see increased dimpling just above the knee. Your thigh  may even shake a bit. 3. Keep these muscles tight as you raise your leg 4-6 inches (10-15 cm) off the floor. Do not let your knee bend. 4. Hold this position for __________ seconds. 5. Keep these muscles tense as you lower your leg. 6. Relax your muscles slowly and completely after each repetition. Repeat __________ times. Complete this exercise __________ times a day. Exercise G: Hamstring, Isometric  1. Lie on your back on a firm surface. 2. Bend your left / right knee approximately __________ degrees. 3. Dig your left / right heel into the surface as if you are trying to pull it toward your buttocks. Tighten the muscles in the back of your thighs to dig as hard as you can without increasing any pain. 4. Hold this position for __________ seconds. 5. Release the tension gradually and allow your muscles to relax completely for __________ seconds after each repetition. Repeat __________ times. Complete this exercise __________ times a day. Exercise H: Hamstring Curls   If told by your health care provider, do this exercise while wearing ankle weights. Begin with __________ weights. Then increase the weight by 1 lb (0.5 kg) increments. Do not wear ankle weights that are more than __________. 1. Lie on your abdomen with your legs straight. 2. Bend your left / right knee as far as you can without feeling pain. Keep your hips flat against the floor. 3. Hold this position for __________ seconds. 4. Slowly lower your leg to the starting position. Repeat __________ times. Complete this exercise __________ times a day. Exercise I: Squats (Quadriceps)  1. Stand in front of a table, with your feet and knees pointing straight ahead. You may rest your hands on the table for balance but not for support. 2. Slowly bend your knees and lower your hips like you are going to sit in a chair.  Keep your weight over your heels, not over your toes.  Keep your lower legs upright so they are parallel with the  table legs.  Do not let your hips go lower than your knees.  Do not bend lower than told by your health care provider.  If your knee pain increases, do not bend as low. 3. Hold the squat position for __________ seconds. 4. Slowly push with your legs to return to standing. Do not use your hands to pull yourself to standing. Repeat __________ times. Complete this exercise __________ times a day. Exercise J: Wall Slides (Quadriceps)   1. Lean your back against a smooth wall or door while you walk your feet out 18-24 inches (46-61 cm) from it. 2. Place your feet hip-width apart. 3. Slowly slide down the wall or door until your knees Repeat __________ times. Complete this exercise __________ times a day. 4. Exercise K: Straight Leg Raises - Hip Abductors  1. Lie on your side with your left / right leg   in the top position. Lie so your head, shoulder, knee, and hip line up. You may bend your bottom knee to help you keep your balance. 2. Roll your hips slightly forward so your hips are stacked directly over each other and your left / right knee is facing forward. 3. Leading with your heel, lift your top leg 4-6 inches (10-15 cm). You should feel the muscles in your outer hip lifting.  Do not let your foot drift forward.  Do not let your knee roll toward the ceiling. 4. Hold this position for __________ seconds. 5. Slowly return your leg to the starting position. 6. Let your muscles relax completely after each repetition. Repeat __________ times. Complete this exercise __________ times a day. Exercise L: Straight Leg Raises - Hip Extensors  1. Lie on your abdomen on a firm surface. You can put a pillow under your hips if that is more comfortable. 2. Tense the muscles in your buttocks and lift your left / right leg about 4-6 inches (10-15 cm). Keep your knee straight as you lift your leg. 3. Hold this position for __________ seconds. 4. Slowly lower your leg to the starting position. 5. Let  your leg relax completely after each repetition. Repeat __________ times. Complete this exercise __________ times a day. This information is not intended to replace advice given to you by your health care provider. Make sure you discuss any questions you have with your health care provider. Document Released: 02/08/2005 Document Revised: 12/20/2015 Document Reviewed: 01/31/2015 Elsevier Interactive Patient Education  2017 [employer]  
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
## 2016-06-19 ENCOUNTER — Ambulatory Visit: Payer: Medicaid Other | Admitting: Family Medicine

## 2016-06-20 ENCOUNTER — Ambulatory Visit (INDEPENDENT_AMBULATORY_CARE_PROVIDER_SITE_OTHER): Payer: Medicaid Other | Admitting: Obstetrics and Gynecology

## 2016-06-20 ENCOUNTER — Encounter: Payer: Self-pay | Admitting: Obstetrics and Gynecology

## 2016-06-20 VITALS — BP 104/70 | HR 100 | Temp 98.0°F | Wt 131.0 lb

## 2016-06-20 DIAGNOSIS — Z3040 Encounter for surveillance of contraceptives, unspecified: Secondary | ICD-10-CM | POA: Diagnosis not present

## 2016-06-20 DIAGNOSIS — Z3042 Encounter for surveillance of injectable contraceptive: Secondary | ICD-10-CM

## 2016-06-20 DIAGNOSIS — Z975 Presence of (intrauterine) contraceptive device: Secondary | ICD-10-CM

## 2016-06-20 DIAGNOSIS — Z30431 Encounter for routine checking of intrauterine contraceptive device: Secondary | ICD-10-CM | POA: Diagnosis not present

## 2016-06-20 DIAGNOSIS — Z538 Procedure and treatment not carried out for other reasons: Secondary | ICD-10-CM | POA: Diagnosis not present

## 2016-06-20 LAB — POCT URINE PREGNANCY: Preg Test, Ur: NEGATIVE

## 2016-06-20 MED ORDER — MEDROXYPROGESTERONE ACETATE 150 MG/ML IM SUSP
150.0000 mg | Freq: Once | INTRAMUSCULAR | Status: AC
  Administered 2016-06-20: 150 mg via INTRAMUSCULAR

## 2016-06-20 NOTE — Assessment & Plan Note (Signed)
Patient had removal of IUD that was placed October 2012. Due to being past date for removal patient had urine pregnancy. Pregnancy test negative. See separate procedure note. Patient also underwent attempt at reinsertion of IUD however patient did not sound to appropriate depth. Procedure was abandoned. Patient given Depo-Provera for birth control until new IUD can be placed. Referral placed to gynecology for IUD insertion. May need ultrasound but will defer that to gynecology.

## 2016-06-20 NOTE — Progress Notes (Signed)
PROCEDURE NOTE: IUD REMOVAL AND RE-INSERTION  IUD REMOVAL: Patient given informed consent for IUD removal and re-insertion of new IUD. Signed copy in the chart. Appropriate time out taken. Sterile technique used. Patient placed in the lithotomy position and the cervix brought into view using speculum. The IUD strings were identified coming from the cervical os. These strings were grasped with ring forceps, and the IUD withdrawn gently from the uterus. There were no complications and no blood loss.    IUD RE-INSERTION: Sterile technique maintained. Cervix cleansed three times with  betadine swabs.  A tenaculum was placed into the anterior lip of the cervix and a uterine sound was used to measure uterine size. Uterus sounded to 4-5cm. Due to concern for uterus being positioned differently tenaculum placed on posterior lip of cervix. Still did not sound completely. Procedure was discontinued and all equipment removed.   Did not feel comfortable inserting IUD with the above measurements as patient should be sounding 7-9cm in young multiparus patient. Referral placed to gynecology for IUD insertion.

## 2016-06-20 NOTE — Patient Instructions (Signed)
Depo given today for birth control; good for 3 months Next injection would be between May 29th-Jun 12.  Gyn referral placed for IUD insertion. Someone will call for details  You may bleed for the next 24hrs.

## 2016-06-20 NOTE — Progress Notes (Signed)
     Subjective: Chief Complaint  Patient presents with  . Contraception     HPI: Julia May is a 36 y.o. presenting to clinic today IUD removal and insertion. Patient states that she had no complications with prior IUD. She does not want any more children. Also likes IUD due to benefits of not having menstrual period. Patient denies any abdominal pain, fevers, discharge, bleeding.  ROS noted in HPI.   Past Medical, Surgical, Social, and Family History Reviewed & Updated per EMR.     History  Smoking Status  . Current Every Day Smoker  . Packs/day: 0.50  . Years: 7.00  . Types: Cigarettes  Smokeless Tobacco  . Never Used   Objective: BP 104/70   Pulse 100   Temp 98 F (36.7 C) (Oral)   Wt 131 lb (59.4 kg)   LMP  (LMP Unknown)   SpO2 99%   BMI 23.96 kg/m  Vitals and nursing notes reviewed  Physical Exam  Constitutional: She is well-developed, well-nourished, and in no distress.  Abdominal: Soft. She exhibits no distension. There is tenderness.  Genitourinary: Vagina normal and cervix normal.  Psychiatric: Her mood appears anxious.    Results for orders placed or performed in visit on 06/20/16 (from the past 72 hour(s))  POCT urine pregnancy     Status: None   Collection Time: 06/20/16  3:30 PM  Result Value Ref Range   Preg Test, Ur Negative Negative    Assessment/Plan: Please see problem based Assessment and Plan PATIENT EDUCATION PROVIDED: See AVS     Orders Placed This Encounter  Procedures  . Ambulatory referral to Gynecology    Referral Priority:   Routine    Referral Type:   Consultation    Referral Reason:   Specialty Services Required    Requested Specialty:   Gynecology    Number of Visits Requested:   1  . POCT urine pregnancy     Caryl AdaJazma Phelps, DO 06/20/2016, 2:59 PM PGY-3, [facility]

## 2016-06-20 NOTE — Addendum Note (Signed)
Addended by: Steva ColderSCOTT, Rhone Ozaki P on: 06/20/2016 04:19 PM   Modules accepted: Orders

## 2016-06-21 MED ORDER — LEVONORGESTREL 20 MCG/24HR IU IUD: INTRAUTERINE_SYSTEM | Freq: Once | INTRAUTERINE | Status: DC

## 2016-06-21 NOTE — Addendum Note (Signed)
Addended by: Jone BasemanFLEEGER, Izela Altier D on: 06/21/2016 08:49 AM   Modules accepted: Orders

## 2016-06-26 ENCOUNTER — Ambulatory Visit: Payer: Medicaid Other | Admitting: Family Medicine

## 2016-06-27 ENCOUNTER — Encounter: Payer: Self-pay | Admitting: Family Medicine

## 2016-06-27 ENCOUNTER — Ambulatory Visit (INDEPENDENT_AMBULATORY_CARE_PROVIDER_SITE_OTHER): Payer: Self-pay | Admitting: Family Medicine
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
## 2016-07-19 ENCOUNTER — Encounter: Payer: Self-pay | Admitting: Obstetrics & Gynecology

## 2016-08-04 ENCOUNTER — Ambulatory Visit (INDEPENDENT_AMBULATORY_CARE_PROVIDER_SITE_OTHER): Payer: Self-pay | Admitting: Obstetrics and Gynecology

## 2016-08-04 ENCOUNTER — Encounter: Payer: Self-pay | Admitting: Obstetrics and Gynecology

## 2016-08-04 VITALS — BP 146/88 | HR 84 | Temp 98.4°F | Ht 62.0 in | Wt 132.6 lb

## 2016-08-04 DIAGNOSIS — B353 Tinea pedis: Secondary | ICD-10-CM

## 2016-08-04 MED ORDER — TERBINAFINE HCL 250 MG PO TABS: 250.0000 mg | ORAL_TABLET | Freq: Every day | ORAL | 0 refills | Status: DC

## 2016-08-04 MED ORDER — KETOCONAZOLE 2 % EX CREA: 1.0000 "application " | TOPICAL_CREAM | Freq: Two times a day (BID) | CUTANEOUS | 0 refills | Status: DC | PRN

## 2016-08-04 NOTE — Patient Instructions (Signed)
Athlete's Foot Athlete's foot (tinea pedis) is a fungal infection of the skin on the feet. It often occurs on the skin that is between or underneath the toes. It can also occur on the soles of the feet. The infection can spread from person to person (is contagious). What are the causes? Athlete's foot is caused by a fungus. This fungus grows in warm, moist places. Most people get athlete's foot by sharing shower stalls, towels, and wet floors with someone who is infected. Not washing your feet or changing your socks often enough can contribute to athlete's foot. What increases the risk? This condition is more likely to develop in:  Men.  People who have a weak body defense system (immune system).  People who have diabetes.  People who use public showers, such as at a gym.  People who wear heavy-duty shoes, such as industrial or military shoes.  Seasons with warm, humid weather. What are the signs or symptoms? Symptoms of this condition include:  Itchy areas between the toes or on the soles of the feet.  White, flaky, or scaly areas between the toes or on the soles of the feet.  Very itchy small blisters between the toes or on the soles of the feet.  Small cuts on the skin. These cuts can become infected.  Thick or discolored toenails. How is this diagnosed? This condition is diagnosed with a medical history and physical exam. Your health care provider may also take a skin or toenail sample to be examined. How is this treated? Treatment for this condition includes antifungal medicines. These may be applied as powders, ointments, or creams. In severe cases, an oral antifungal medicine may be given. Follow these instructions at home:  Apply or take over-the-counter and prescription medicines only as told by your health care provider.  Keep all follow-up visits as told by your health care provider. This is important.  Do not scratch your feet.  Keep your feet dry:  Wear cotton  or wool socks. Change your socks every day or if they become wet.  Wear shoes that allow air to circulate, such as sandals or canvas tennis shoes.  Wash and dry your feet:  Every day or as told by your health care provider.  After exercising.  Including the area between your toes.  Do not share towels, nail clippers, or other personal items that touch your feet with others.  If you have diabetes, keep your blood sugar under control. How is this prevented?  Do not share towels.  Wear sandals in wet areas, such as locker rooms and shared showers.  Keep your feet dry:  Wear cotton or wool socks. Change your socks every day or if they become wet.  Wear shoes that allow air to circulate, such as sandals or canvas tennis shoes.  Wash and dry your feet after exercising. Pay attention to the area between your toes. Contact a health care provider if:  You have a fever.  You have swelling, soreness, warmth, or redness in your foot.  You are not getting better with treatment.  Your symptoms get worse.  You have new symptoms. This information is not intended to replace advice given to you by your health care provider. Make sure you discuss any questions you have with your health care provider. Document Released: 03/24/2000 Document Revised: 09/02/2015 Document Reviewed: 09/28/2014 Elsevier Interactive Patient Education  2017 Elsevier Inc.  

## 2016-08-04 NOTE — Progress Notes (Signed)
   Subjective:   Patient ID: Julia May, female    DOB: [birth year], 36 y.o.   MRN: [id number]  Patient presents for Same Day Appointment  Chief Complaint  Patient presents with  . blister on bottom of left foot    HPI: # Blister on foot Patient noticed a painful blister on the bottom of her left foot for the last 3-4 days. It is now becoming difficult to walk due to location and pain. Has gotten bigger. Area also has associated itching. Denies any fevers or surrounding erythema. No other similar skin lesions. Has had something like this before.   Review of Systems   See HPI for ROS.   History  Smoking Status  . Current Every Day Smoker  . Packs/day: 0.50  . Years: 7.00  . Types: Cigarettes  Smokeless Tobacco  . Never Used    Past medical history, surgical, family, and social history reviewed and updated in the EMR as appropriate.  Pertinent Historical Findings include: anemia, alcohol abuse Objective:  BP (!) 146/88   Pulse 84   Temp 98.4 F (36.9 C) (Oral)   Ht  (1.575 m)   Wt 132 lb 9.6 oz (60.1 kg)   SpO2 100%   BMI 24.25 kg/m  Vitals and nursing note reviewed  Physical Exam  Constitutional: She is well-developed, well-nourished, and in no distress.  Musculoskeletal:       Feet:  Skin: Skin is warm and dry.  Interdigitally white rash present on bilateral feet.    Incision and Drainage Procedure Note Indications: Painful Blister  Anesthesia: ethyl chloride spray  Procedure Details  Verbal consent given. Time out performed.   The skin was sterilely prepped and draped over the affected area in the usual fashion. After adequate local anesthesia, I&D with a #11 blade was performed on the left plantar surface of foot. Purulent drainage: absent. Drained serous fluid. No blood lose. Patient tolerated procedure well. After care instructions  Given.    Assessment & Plan:  1. 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  This diagnosis also is consistent with interdigital rash. Will treat with Rx for terbinafine. Patient also given Rx for topical azole. No signs of bacterial infection so will hold off on antibiotics at this time. I&D blister preformed (see procedure note).  Diagnosis and plan along with any newly prescribed medication(s) were discussed in detail with this patient today. The patient verbalized understanding and agreed with the plan. Patient advised if symptoms worsen return to clinic or ER.   PATIENT EDUCATION PROVIDED: See AVS   Caryl Ada, DO 08/04/2016, 3:15 PM PGY-3, [facility]

## 2016-08-14 ENCOUNTER — Telehealth: Payer: Self-pay | Admitting: *Deleted

## 2016-08-14 ENCOUNTER — Ambulatory Visit: Payer: Medicaid Other | Admitting: Obstetrics & Gynecology

## 2016-08-14 NOTE — Telephone Encounter (Signed)
Per Dr. Marice Potterove patient does not need to be called to reschedule appointment. She may reschedule if she desires to do so.

## 2016-08-18 ENCOUNTER — Ambulatory Visit (INDEPENDENT_AMBULATORY_CARE_PROVIDER_SITE_OTHER): Payer: Self-pay | Admitting: Family Medicine

## 2016-08-18 ENCOUNTER — Encounter: Payer: Self-pay | Admitting: Family Medicine

## 2016-08-18 VITALS — BP 110/68 | HR 100 | Temp 99.0°F | Wt 129.0 lb

## 2016-08-18 DIAGNOSIS — B353 Tinea pedis: Secondary | ICD-10-CM

## 2016-08-18 LAB — POCT SKIN KOH: SKIN KOH, POC: POSITIVE — AB

## 2016-08-18 MED ORDER — ITRACONAZOLE 200 MG PO TABS: 200.0000 mg | ORAL_TABLET | Freq: Two times a day (BID) | ORAL | 0 refills | Status: AC

## 2016-08-18 MED ORDER — CEPHALEXIN 500 MG PO CAPS: 500.0000 mg | ORAL_CAPSULE | Freq: Four times a day (QID) | ORAL | 0 refills | Status: AC

## 2016-08-18 NOTE — Assessment & Plan Note (Signed)
Vesiculobullous tinea pedis, confirmed by KOH prep. Since has failed terbinafine, will try itraconazole and also treat for potential bacterial infection with keflex. Patient will keep area clean and dry and get new shoes. If no improvement in 1 week, then will refer to podiatry.

## 2016-08-18 NOTE — Patient Instructions (Signed)
It was good to meet you today  Please take itraconazole 200mg  twice a day for 1 week for the fungal infection. In case you have a bacterial infection, please also take keflex 500mg  four times a day for 1 week.  If this does not improve by next week, we will likely need to send you to podiatry.   Take care and seek immediate care sooner if you develop any concerns.   Dr. Leland HerElsia J Savera Donson, DO [facility]

## 2016-08-18 NOTE — Progress Notes (Signed)
    Subjective:  Julia May is a 36 y.o. female [facility] with a chief complaint of foot blisters.   HPI:  Has had worsening of the blisters on her foot. Took the terbafine for 2 weeks and tried the topical ketoconazole without relief. Now has 3 blisters instead of 1, which are very painful and itchy. Has been using tinactin for symptomatic relief. 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      ROS: Per HPI  Objective:  Physical Exam: BP 110/68   Pulse 100   Temp 99 F (37.2 C) (Oral)   Wt 129 lb (58.5 kg)   SpO2 99%   BMI 23.59 kg/m   Gen: NAD, resting comfortably MSK: please see attached photo for bullae that are very tender to palpation and weeping clear fluid.      Results for orders placed or performed in visit on 08/18/16 (from the past 72 hour(s))  POCT Skin KOH     Status: Abnormal   Collection Time: 08/18/16  2:40 PM  Result Value Ref Range   Skin KOH, POC Positive (A) Negative    Assessment/Plan:  Tinea pedis of left foot Vesiculobullous tinea pedis, confirmed by KOH prep. Since has failed terbinafine, will try itraconazole and also treat for potential bacterial infection with keflex. Patient will keep area clean and dry and get new shoes. If no improvement in 1 week, then will refer to podiatry.   [name], DO PGY-1, [facility] 08/18/2016 4:01 PM

## 2016-09-05 ENCOUNTER — Ambulatory Visit: Payer: Medicaid Other

## 2016-09-06 ENCOUNTER — Other Ambulatory Visit: Payer: Self-pay | Admitting: Family Medicine

## 2016-09-07 ENCOUNTER — Other Ambulatory Visit: Payer: Self-pay | Admitting: Family Medicine

## 2016-09-07 MED ORDER — KETOCONAZOLE 2 % EX CREA: 1.0000 "application " | TOPICAL_CREAM | Freq: Two times a day (BID) | CUTANEOUS | 0 refills | Status: DC | PRN

## 2016-09-07 NOTE — Telephone Encounter (Signed)
Pt needs a refill on ketoconazole. Pt uses [facility] [address] ep

## 2016-11-17 ENCOUNTER — Ambulatory Visit (INDEPENDENT_AMBULATORY_CARE_PROVIDER_SITE_OTHER): Payer: Self-pay | Admitting: Student

## 2016-11-17 ENCOUNTER — Encounter: Payer: Self-pay | Admitting: Student

## 2016-11-17 VITALS — BP 134/90 | HR 78 | Temp 98.3°F | Ht 62.0 in | Wt 125.2 lb

## 2016-11-17 DIAGNOSIS — N912 Amenorrhea, unspecified: Secondary | ICD-10-CM

## 2016-11-17 DIAGNOSIS — R030 Elevated blood-pressure reading, without diagnosis of hypertension: Secondary | ICD-10-CM

## 2016-11-17 HISTORY — DX: Amenorrhea, unspecified: N91.2

## 2016-11-17 LAB — POCT URINALYSIS DIP (MANUAL ENTRY)
Blood, UA: NEGATIVE
Glucose, UA: NEGATIVE mg/dL
LEUKOCYTES UA: NEGATIVE
NITRITE UA: NEGATIVE
Protein Ur, POC: NEGATIVE mg/dL
Spec Grav, UA: >=1.03 — AB (ref 1.010–1.025)
Urobilinogen, UA: 0.2 E.U./dL
pH, UA: 6 (ref 5.0–8.0)

## 2016-11-17 LAB — POCT URINE PREGNANCY: Preg Test, Ur: NEGATIVE

## 2016-11-17 NOTE — Patient Instructions (Addendum)
It was great seeing you today! We have addressed the following issues today 1. Elevated blood pressure: your blood pressure is 120/90 today. We are checking the blood and urine. Meanwhile, I strongly recommend you quit smoking cigarettes and marijuana. I strongly recommend against drinking more than one beer (8 ounces) a day. Please seek immediate care or call 911 if you have chest pain, severe headache, weakness, numbness or tingling in his arms or legs, vision changes, slurred speech or other symptoms concerning to you. I recommend follow-up with your primary care doctor in 1-2 months.   If we did any lab work today, and the results require attention, either me or my nurse will get in touch with you. If everything is normal, you will get a letter in mail and a message via . If you don't hear from us in two weeks, please give us a call. Otherwise, we look forward to seeing you again at your next visit. If you have any questions or concerns before then, please call the clinic at [phone].  Please bring all your medications to every doctors visit  Sign up for My Chart to have easy access to your labs results, and communication with your Primary care physician.    Please check-out at the front desk before leaving the clinic.    Take Care,   Dr. Alanda SlimGonfa

## 2016-11-17 NOTE — Progress Notes (Signed)
Subjective:    Julia May is a 36 y.o. old female here SDA for blood pressure check  HPI Blood pressure: Patient was at work yesterday when she feels dizzy (swimmy headed), flashes in her eyes and left-sided chest pain. They checked her blood pressure and it was 200/110. They called EMS. When EMS arrived, they checked a EKG which was normal. She did not go to emergency department. Her symptoms resolved and she call the clinic to schedule this appointment. Patient denies history of hypertension or anxiety. She denies new prescription over-the-counter medication.  Today, she denies dizziness, vision changes, numbness or tingling in arms or legs, chest pain or shortness of breath.   Patient reports smoking half a pack a day. She reports drinking about 10 shots of liquor (tequila) and smoking marijuana daily. She also admits occasional cocaine use. She says her last use was about 2 months ago. Patient works as a Quarry manager. PMH/Problem List: has ANEMIA, IRON DEFICIENCY, UNSPEC.; TOBACCO DEPENDENCE; Alcoholism /alcohol abuse ([place]); Tinea pedis of left foot; Joint pain; Contraceptive management; Transient elevated blood pressure; and Amenorrhea on her problem list.   has a past medical history of Blurred vision, right eye (11/30/2015).  FH:  No family history on file.  [place] Social History  Substance Use Topics  . Smoking status: Current Every Day Smoker    Packs/day: 0.50    Years: 7.00    Types: Cigarettes  . Smokeless tobacco: Never Used  . Alcohol use No     Comment: quit 38 days ago 03/09/11 todays date    Review of Systems Review of systems negative except for pertinent positives and negatives in history of present illness above.     Objective:     Vitals:   11/17/16 1119  BP: 134/90  Pulse: 78  Temp: 98.3 F (36.8 C)  TempSrc: Oral  SpO2: 99%  Weight: 125 lb 3.2 oz (56.8 kg)  Height: '5\' 2"'  (1.575 m)    Physical Exam GEN: appears well, no apparent distress. Head: normocephalic  and atraumatic  Eyes: conjunctiva without injection, sclera anicteric, PERRLA, EOMI, intact visual field Oropharynx: mmm without erythema or exudation HEM: negative for cervical or periauricular lymphadenopathies ENDO: diffuse thyromegaly. No palpable nodes.  CVS: RRR, nl S1&S2, no murmurs, no edema, carotid or renal bruits. Radial, femoral and DP pulses 2+ bilaterally.  Repeat BP 120/90 in right arm and 120/85 in left arm RESP: no IWOB, good air movement bilaterally, CTAB GI: BS present & normal, soft, NTND MSK: no focal tenderness or notable swelling SKIN: no apparent skin lesion NEURO: Awake, alert and oriented appropriately. Cranial nerves II-XII intact, motor 5/5 in all muscle groups of UE and LE bilaterally, normal tone, light sensation intact in all dermatomes of upper and lower ext bilaterally, no pronator drift, biceps, patellar and achille reflexes 2+ bilaterally, finger to nose intact, gait is normal PSYCH: euthymic mood with congruent affect    Assessment and Plan:  Transient elevated blood pressure Initial blood pressure 134/90. Repeat blood pressure 120/90 in right arm and 120/85 in left arm. Patient is currently a symptomatic. Cardiopulmonary and neuro exam within normal limits. She brought EKG results done by EMS which shows normal sinus rhythm without ischemic changes. UA without glucose or protein. She has diffuse thyromegaly on my exam. She also reports heavy drinking and substance use including marijuana and cocaine. -Discussed the questions consequence of his smoking and drinking on his health and I strongly urged her to quit smoking and drinking.  -We'll obtain  CMP, TSH and free T4 -Discussed return precautions as in AVS. -Recommended follow-up with PCP in 1-2 months or sooner if needed.  Amenorrhea She says she hasn't had her period for almost over a year since she was on IUD. She is sexually active. Urine pregnancy test is normal.  Orders Placed This Encounter    Procedures  . TSH  . T4, Free  . CMP14+EGFR  . POCT urinalysis dipstick  . POCT urine pregnancy   Return if symptoms worsen or fail to improve.  Mercy Riding, MD 11/17/16 Pager: [phone]

## 2016-11-17 NOTE — Assessment & Plan Note (Addendum)
She says she hasn't had her period for almost over a year since she was on IUD. She is sexually active. Urine pregnancy test is normal.

## 2016-11-17 NOTE — Assessment & Plan Note (Addendum)
Initial blood pressure 134/90. Repeat blood pressure 120/90 in right arm and 120/85 in left arm. Patient is currently a symptomatic. Cardiopulmonary and neuro exam within normal limits. She brought EKG results done by EMS which shows normal sinus rhythm without ischemic changes. UA without glucose or protein. She has diffuse thyromegaly on my exam. She also reports heavy drinking and substance use including marijuana and cocaine. -Discussed the questions consequence of his smoking and drinking on his health and I strongly urged her to quit smoking and drinking.  -We'll obtain CMP, TSH and free T4 -Discussed return precautions as in AVS. -Recommended follow-up with PCP in 1-2 months or sooner if needed. -Recommended checking her blood pressure intermittently

## 2016-11-18 ENCOUNTER — Encounter: Payer: Self-pay | Admitting: Student

## 2016-11-18 LAB — CMP14+EGFR
ALT: 16 IU/L (ref 0–32)
AST: 25 IU/L (ref 0–40)
Albumin/Globulin Ratio: 1.6 (ref 1.2–2.2)
Albumin: 4.7 g/dL (ref 3.5–5.5)
Alkaline Phosphatase: 79 IU/L (ref 39–117)
BUN / CREAT RATIO: 15 (ref 9–23)
BUN: 15 mg/dL (ref 6–20)
Bilirubin Total: 0.7 mg/dL (ref 0.0–1.2)
CALCIUM: 9.8 mg/dL (ref 8.7–10.2)
CO2: 22 mmol/L (ref 20–29)
Chloride: 100 mmol/L (ref 96–106)
Creatinine, Ser: 1.01 mg/dL — ABNORMAL HIGH (ref 0.57–1.00)
GFR calc Af Amer: 83 mL/min/{1.73_m2} (ref >59)
GFR calc non Af Amer: 72 mL/min/{1.73_m2} (ref >59)
Globulin, Total: 3 g/dL (ref 1.5–4.5)
Glucose: 99 mg/dL (ref 65–99)
Potassium: 4.1 mmol/L (ref 3.5–5.2)
SODIUM: 139 mmol/L (ref 134–144)
Total Protein: 7.7 g/dL (ref 6.0–8.5)

## 2016-11-18 LAB — T4, FREE: Free T4: 1.15 ng/dL (ref 0.82–1.77)

## 2016-11-18 LAB — TSH: TSH: 0.687 u[IU]/mL (ref 0.450–4.500)

## 2016-11-20 ENCOUNTER — Telehealth: Payer: Self-pay | Admitting: *Deleted

## 2016-11-20 ENCOUNTER — Encounter: Payer: Self-pay | Admitting: Student

## 2016-11-20 NOTE — Telephone Encounter (Signed)
Letter faxed to patient at [phone]. Kinnie FeilL. Ducatte, RN, BSN

## 2016-11-20 NOTE — Telephone Encounter (Signed)
Message should be sent to the provider who saw her. Thanks.

## 2016-11-20 NOTE — Telephone Encounter (Signed)
Letter available for pick up at front desk. Could someone call her and let her know. Thanks, Alwyn Renaye

## 2016-11-20 NOTE — Progress Notes (Signed)
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

## 2016-11-20 NOTE — Telephone Encounter (Signed)
Patient was seen Fri 8/10 for elevated BP and anemia. She is currently at work and they are requiring a note from provider that it is ok for her to be at work. Please fax to [phone]. Kinnie FeilL. Cortlyn Cannell, RN, BSN

## 2017-01-18 ENCOUNTER — Ambulatory Visit: Payer: Self-pay | Admitting: Family Medicine

## 2017-01-19 ENCOUNTER — Emergency Department (HOSPITAL_COMMUNITY)
Admission: EM | Admit: 2017-01-19 | Discharge: 2017-01-19 | Disposition: A | Discharge status: Home / Self Care | Payer: Self-pay | Attending: Emergency Medicine | Admitting: Emergency Medicine

## 2017-01-19 ENCOUNTER — Ambulatory Visit: Payer: Self-pay | Admitting: Family Medicine

## 2017-01-19 ENCOUNTER — Encounter: Payer: Self-pay | Admitting: Family Medicine

## 2017-01-19 ENCOUNTER — Ambulatory Visit (INDEPENDENT_AMBULATORY_CARE_PROVIDER_SITE_OTHER): Payer: Self-pay | Admitting: Family Medicine

## 2017-01-19 ENCOUNTER — Encounter (HOSPITAL_COMMUNITY): Payer: Self-pay | Admitting: *Deleted

## 2017-01-19 VITALS — BP 124/70 | HR 65 | Temp 98.1°F | Ht 62.0 in | Wt 125.0 lb

## 2017-01-19 DIAGNOSIS — R519 Headache, unspecified: Secondary | ICD-10-CM

## 2017-01-19 DIAGNOSIS — R51 Headache: Secondary | ICD-10-CM | POA: Insufficient documentation

## 2017-01-19 DIAGNOSIS — Z5321 Procedure and treatment not carried out due to patient leaving prior to being seen by health care provider: Secondary | ICD-10-CM | POA: Insufficient documentation

## 2017-01-19 NOTE — Assessment & Plan Note (Addendum)
Located over R side of face, mainly over R eye. 3 days duration, not relieved by tylenol/ibuprofen. No trauma. Exacerbated by light. No history of migraines. Happened once before a few months ago with associated increased blood pressures. BP 124/70 today. History is particularly concerning as hasn't had history of headaches in the past. Retina exam not well visualized in the clinic. Eyes tearing on exam. Normal neurologically otherwise. Tender to palpation over R eye.  Could be glaucoma as has had blurry vision and feeling off balance. Will need eye pressure assessed with slit lamp which we do not have here in the clinic. Will likely also need head imaging as this headache is new onset. Could also be severe sinus infection due to location of pain but not having any fevers. Will instruct patient to go straight to the ED for further workup. Will call triage to let them know.

## 2017-01-19 NOTE — ED Triage Notes (Signed)
Pt sent her from UC to be evaluated for HA onset today with blurred vision in R eye, denies injury, denies drainage, c/o sensitivity to light, denies n/v/d currently, pt ambulatory, per EPIC pt has hx of R eye blurred vision, per UC AVS pt to come here to be evaluated with slip lamp, pt A&O x4

## 2017-01-19 NOTE — ED Notes (Signed)
Pt did not answer for vitals.  

## 2017-01-19 NOTE — Progress Notes (Signed)
Subjective:   Patient ID: Julia May    DOB: 1980/07/07, 36 y.o. female   MRN: [id number]  Julia May is a 36 y.o. female with a history of anemia, tobacco use disorder here for   Headache - x3 days - tried ibuprofen, tylenol - some relief for a little bit. Not tried heating pad, tried ice pack on back of neck, felt better. - located on the R side behind R eye - states headache was coming and going for the first 2 days, but constant since yesterday - not getting much sleep - Endorsing blurry vision, nausea, feeling off balance, photophobia. Vomited 2x at work on Tuesday. Some chills, no fevers.  - feels like "grabbing/pressure" pain, constant, not sharp. Feels like R eye is protruding - worse in the morning - happened a few months ago, was told it was due to high blood pressures - had BP checked at work on Tuesday and was 158/85. Has been check blood pressures and has been ok since then. 124/70 today. - no h/o migraines - h/o ear infections, but no ear drainage or pain recently. - no trauma recently.  Review of Systems:  Per HPI.   PMFSH: reviewed. Smoking status reviewed. Medications reviewed.  Objective:   BP 124/70   Pulse 65   Temp 98.1 F (36.7 C) (Oral)   Ht  (1.575 m)   Wt 125 lb (56.7 kg)   LMP  (LMP Unknown)   SpO2 98%   BMI 22.86 kg/m  Vitals and nursing note reviewed.  General: well nourished, well developed, in moderately acute distress HEENT: normocephalic, atraumatic, moist mucous membranes. R eye slightly edematous as compared to the L. PERRLA. EOMI. Eyes tearing with exposure to light. Neck: supple, non-tender without lymphadenopathy CV: regular rate and rhythm without murmurs, rubs, or gallops Lungs: clear to auscultation bilaterally with normal work of breathing Skin: warm, dry, no rashes or lesions Extremities: warm and well perfused, normal tone MSK: Full ROM, strength 5/5 to U/LE bilaterally, normal gait.  No edema.  Neuro: Alert and  oriented, speech normal.  Optic field normal. PERRL, Extraocular movements intact.  Intact symmetric sensation to light touch of face and extremities bilaterally.  Hearing grossly intact bilaterally.  Tongue protrudes normally with no deviation.  Shoulder shrug, smile symmetric. Finger to nose normal.  Assessment & Plan:   Acute intractable headache Located over R side of face, mainly over R eye. 3 days duration, not relieved by tylenol/ibuprofen. No trauma. Exacerbated by light. No history of migraines. Happened once before a few months ago with associated increased blood pressures. BP 124/70 today. History is particularly concerning as hasn't had history of headaches in the past. Retina exam not well visualized in the clinic. Eyes tearing on exam. Normal neurologically otherwise. Tender to palpation over R eye.  Could be glaucoma as has had blurry vision and feeling off balance. Will need eye pressure assessed with slit lamp which we do not have here in the clinic. Will likely also need head imaging as this headache is new onset. Could also be severe sinus infection due to location of pain but not having any fevers. Will instruct patient to go straight to the ED for further workup. Will call triage to let them know.   No orders of the defined types were placed in this encounter.  No orders of the defined types were placed in this encounter.   Ellwood Dense, DO PGY-1, [facility] 01/19/2017 4:25 PM

## 2017-01-19 NOTE — ED Notes (Signed)
Patient left without being seen, Because she said Urgent Care sent here to the ED because she had Blurred Vision, So she felt that she shouldn't have to wait 2 hour to be seen, she Thought that she was suppose to get a bed as soon as she came, I tried to explain to her that she would be going back soon, She got upset and walked out.

## 2017-01-19 NOTE — Patient Instructions (Signed)
It was great to see you!  For your headache,  - We are giving you a copy of today's visit note - take this to the ED now and receive further workup for your headache. - We are requesting a slit lamp exam of your eye to check pressures. You may also need head imaging.  Ellwood Dense, DO [facility]

## 2017-06-07 ENCOUNTER — Other Ambulatory Visit: Payer: Self-pay

## 2017-06-07 ENCOUNTER — Encounter: Payer: Self-pay | Admitting: Internal Medicine

## 2017-06-07 ENCOUNTER — Ambulatory Visit (INDEPENDENT_AMBULATORY_CARE_PROVIDER_SITE_OTHER): Payer: Self-pay | Admitting: Internal Medicine

## 2017-06-07 DIAGNOSIS — H6123 Impacted cerumen, bilateral: Secondary | ICD-10-CM

## 2017-06-07 MED ORDER — CARBAMIDE PEROXIDE 6.5 % OT SOLN: 5.0000 [drp] | Freq: Two times a day (BID) | OTIC | 2 refills | Status: DC | PRN

## 2017-06-07 NOTE — Assessment & Plan Note (Addendum)
Significant cerumen impaction bilaterally, likely leading to decreased hearing on L. Hearing somewhat improved after irrigation, though only minimal cerumen removed with two rounds of irrigation. Will begin Debrox drops daily and refer to ENT for ear clean out. As patient seems to produce significant amount of cerumen, may need regular ear cleanings by ENT to prevent impactions.  - Referral to ENT placed

## 2017-06-07 NOTE — Patient Instructions (Addendum)
It was nice meeting you today Julia May!  Please begin using the Debrox (hydrogen peroxide) drops daily to try to soften the wax in your ears. I have placed a referral to ENT (Ear, Nose, and Throat doctors), and they should call you with the date and time of your appointment.   If you have any questions or concerns, please feel free to call the clinic.   Be well,  Dr. Natale MilchLancaster

## 2017-06-07 NOTE — Progress Notes (Signed)
   Subjective:   Patient: Julia May       Birthdate: 12/17/1980       MRN: [id number]      HPI  Julia May is a 37 y.o. female presenting for same day appt for cerumen impaction.   Cerumen impaction Patient says she has history of cerumen impaction. [facility], most recently about a year ago. Was given Ciprodex drops at that time which she is still using, however does not think they are helping. About a week ago she began to feel a build up of wax in her L ear. Says it feels like there is a ball in her ear. 2-3 days ago began to have decreased hearing in that ear, which she attributes to wax build up. Does not use Q tips or anything else to clean her ears. Only uses drops which she has been prescribed.   Smoking status reviewed. Patient is current every day smoker.   Review of Systems See HPI.     Objective:  Physical Exam  Constitutional: She is oriented to person, place, and time and well-developed, well-nourished, and in no distress.  HENT:  Head: Normocephalic and atraumatic.  Significant cerumen impaction bilaterally  Pulmonary/Chest: Effort normal. No respiratory distress.  Neurological: She is alert and oriented to person, place, and time.  Skin: Skin is warm and dry.  Psychiatric: Affect and judgment normal.   Assessment & Plan:  Cerumen impaction Significant cerumen impaction bilaterally, likely leading to decreased hearing on L. Hearing somewhat improved after irrigation, though only minimal cerumen removed with two rounds of irrigation. Will begin Debrox drops daily and refer to ENT for ear clean out. As patient seems to produce significant amount of cerumen, may need regular ear cleanings by ENT to prevent impactions.  - Referral to ENT placed   Tarri AbernethyAbigail J Stephenson Cichy, MD, MPH PGY-3 [facility] Pager [phone]

## 2017-08-14 ENCOUNTER — Other Ambulatory Visit: Payer: Self-pay

## 2017-08-14 ENCOUNTER — Encounter: Payer: Self-pay | Admitting: Family Medicine

## 2017-08-14 ENCOUNTER — Ambulatory Visit (INDEPENDENT_AMBULATORY_CARE_PROVIDER_SITE_OTHER): Payer: Self-pay | Admitting: Family Medicine

## 2017-08-14 VITALS — BP 140/96 | HR 100 | Temp 98.8°F | Ht 62.0 in | Wt 122.6 lb

## 2017-08-14 DIAGNOSIS — J302 Other seasonal allergic rhinitis: Secondary | ICD-10-CM

## 2017-08-14 MED ORDER — OLOPATADINE HCL 0.1 % OP SOLN: 1.0000 [drp] | Freq: Two times a day (BID) | OPHTHALMIC | 2 refills | Status: DC

## 2017-08-14 MED ORDER — MONTELUKAST SODIUM 10 MG PO TABS: 10.0000 mg | ORAL_TABLET | Freq: Every day | ORAL | 3 refills | Status: DC

## 2017-08-14 MED ORDER — PREDNISONE 20 MG PO TABS: 20.0000 mg | ORAL_TABLET | Freq: Every day | ORAL | 0 refills | Status: DC

## 2017-08-14 NOTE — Patient Instructions (Addendum)
It was good to see you today.  We are going to treat you so you don't have to get rid of the dog hopefully.  Take Allegra-D (or Zyrtec-D) once a day.    The main treatment is Flonase Sensimist. This is water-based.  The Flonase is the best treatment.    Use that Pataday drops daily.  If these are too expensive, just use the Flonase.  Stop everything else.    Go to GoodRx.com to get the coupon discounts for the Pataday and the Singulair.

## 2017-08-14 NOTE — Progress Notes (Signed)
Subjective:    Julia May is a 37 y.o. female who presents to [facility] today for seasonal allergies:  1.  Seasonal allergies:  Ongoing issues for the past 3-4 weeks.  She has never had allergies before this.  Did also buy a [place] shepherd puppy roughly same period of time.  Has tried OTC xyzal, zyrtec, mucinex without relief.  Itchy runny eyes, nose.  No real cough.  No fevers, chills, sick contacts.     ROS as above per HPI.     The following portions of the patient's history were reviewed and updated as appropriate: allergies, current medications, past medical history, family and social history, and problem list. Patient is a nonsmoker.    PMH reviewed.  Past Medical History:  Diagnosis Date  . Blurred vision, right eye 11/30/2015   No past surgical history on file.  Medications reviewed. Current Outpatient Medications  Medication Sig Dispense Refill  . albuterol (PROVENTIL HFA;VENTOLIN HFA) 108 (90 Base) MCG/ACT inhaler Inhale 2 puffs into the lungs every 6 (six) hours as needed for wheezing. (Patient not taking: Reported on 06/16/2016) 1 Inhaler 1  . betamethasone valerate (VALISONE) 0.1 % cream apply to affected area twice a day 30 g 1  . carbamide peroxide (DEBROX) 6.5 % OTIC solution Place 5 drops into both ears 2 (two) times daily as needed (for wax buildup). 15 mL 2  . cyclobenzaprine (FLEXERIL) 5 MG tablet Take 1 tablet (5 mg total) by mouth 3 (three) times daily as needed for muscle spasms. 30 tablet 1  . ibuprofen (ADVIL,MOTRIN) 400 MG tablet Take 1 tablet (400 mg total) by mouth every 6 (six) hours as needed. 60 tablet 0  . ketoconazole (NIZORAL) 2 % cream Apply 1 application topically 2 (two) times daily as needed for irritation. Rash on foot 15 g 0  . levonorgestrel (MIRENA) 20 MCG/24HR IUD 1 each by Intrauterine route once. 1 each 0  . traMADol (ULTRAM) 50 MG tablet Take 1 tablet (50 mg total) by mouth every 6 (six) hours as needed. (Patient not taking: Reported on  06/16/2016) 20 tablet 0   Current Facility-Administered Medications  Medication Dose Route Frequency Provider Last Rate Last Dose  . levonorgestrel (MIRENA) 20 MCG/24HR IUD   Intrauterine Once Phelps, Jazma Y, DO         Objective:   Physical Exam BP (!) 140/96   Pulse 100   Temp 98.8 F (37.1 C) (Oral)   Ht  (1.575 m)   Wt 122 lb 9.6 oz (55.6 kg)   LMP 08/10/2017   SpO2 99%   BMI 22.42 kg/m  Gen:  Patient sitting on exam table, appears stated age in no acute distress.  Ongoing sniffling, runny eyes, appears uncomfortable Head: Normocephalic atraumatic Eyes: EOMI, PERRL, sclera injected, clear drainage throughout exam Ears:  Canals clear bilaterally.  TMs pearly gray bilaterally without erythema or bulging.   Nose:  Nasal turbinates grossly enlarged bilaterally, boggy appearing Mouth: Mucosa membranes moist. Tonsils +2, nonenlarged, non-erythematous. Neck: No cervical lymphadenopathy noted Heart:  RRR, no murmurs auscultated. Pulm:  Clear to auscultation bilaterally with good air movement.  No wheezes or rales noted.    Imp/Plan: 1. Seasonal allergies: - see AVS full instructions - prednisone short course due to degree of symptoms - flonase sensimist as she has tried alcohol based but difficulty tolerating this - switch short-term to decongestant.   - pataday to treat - FU in 2 weeks to assess improvement.

## 2018-02-15 ENCOUNTER — Encounter: Payer: Self-pay | Admitting: Family Medicine

## 2018-02-15 ENCOUNTER — Telehealth: Payer: Self-pay | Admitting: Family Medicine

## 2018-02-15 ENCOUNTER — Telehealth: Payer: Self-pay

## 2018-02-15 ENCOUNTER — Other Ambulatory Visit (HOSPITAL_COMMUNITY)
Admission: RE | Admit: 2018-02-15 | Discharge: 2018-02-15 | Disposition: A | Discharge status: Home / Self Care | Payer: Self-pay | Source: Ambulatory Visit | Attending: Family Medicine | Admitting: Family Medicine

## 2018-02-15 ENCOUNTER — Ambulatory Visit (INDEPENDENT_AMBULATORY_CARE_PROVIDER_SITE_OTHER): Payer: Self-pay | Admitting: Family Medicine

## 2018-02-15 ENCOUNTER — Other Ambulatory Visit: Payer: Self-pay

## 2018-02-15 VITALS — BP 120/78 | HR 84 | Temp 98.2°F | Ht 62.0 in | Wt 131.0 lb

## 2018-02-15 DIAGNOSIS — Z113 Encounter for screening for infections with a predominantly sexual mode of transmission: Secondary | ICD-10-CM | POA: Insufficient documentation

## 2018-02-15 DIAGNOSIS — L292 Pruritus vulvae: Secondary | ICD-10-CM

## 2018-02-15 LAB — POCT WET PREP (WET MOUNT)
Clue Cells Wet Prep Whiff POC: NEGATIVE
TRICHOMONAS WET PREP HPF POC: ABSENT

## 2018-02-15 NOTE — Progress Notes (Signed)
   Subjective:    Patient ID: Julia May is a 37 y.o. female presenting with Exposure to STD  on 02/15/2018  HPI: Notes some bump and some itching from shaving. Denies vaginal discharge, fever, chills. Wants STD screen. Has ingrown hair.   Review of Systems  Constitutional: Negative for chills and fever.  Respiratory: Negative for shortness of breath.   Cardiovascular: Negative for chest pain.  Gastrointestinal: Negative for abdominal pain, nausea and vomiting.  Genitourinary: Negative for dysuria.  Skin: Negative for rash.      Objective:    BP 120/78   Pulse 84   Temp 98.2 F (36.8 C) (Oral)   Ht 5\' 2"  (1.575 m)   Wt 131 lb (59.4 kg)   LMP 01/21/2018   SpO2 99%   BMI 23.96 kg/m  Physical Exam  Constitutional: She is oriented to person, place, and time. She appears well-developed and well-nourished. No distress.  HENT:  Head: Normocephalic and atraumatic.  Eyes: No scleral icterus.  Neck: Neck supple.  Cardiovascular: Normal rate.  Pulmonary/Chest: Effort normal.  Abdominal: Soft.  Genitourinary:  Genitourinary Comments: White discharge noted, small firm nodule left vulva  Neurological: She is alert and oriented to person, place, and time.  Skin: Skin is warm and dry.  Psychiatric: She has a normal mood and affect.        Assessment & Plan:  Screening for STD (sexually transmitted disease) - screen for all--await results - Plan: Urine cytology ancillary only, POCT Wet Prep [facility]), HIV antibody (with reflex), RPR, Hepatitis C antibody, Hepatitis B surface antigen, HSV(herpes simplex vrs) 1+2 ab-IgG, HSV(herpes simplex vrs) 1+2 ab-IgM  Vulvar itching - Likely related to shaving--try beard trimmers   Total face-to-face time with patient: 10 minutes. Over 50% of encounter was spent on counseling and coordination of care. Return if symptoms worsen or fail to improve.  Reva Bores 02/15/2018 10:16 AM

## 2018-02-15 NOTE — Patient Instructions (Signed)

## 2018-02-15 NOTE — Telephone Encounter (Signed)
Patient left message stating that some results were supposed to posted on MyChart today but they are not there yet.  Call back is [phone]  Ples Specter, RN [facility] RN)

## 2018-02-15 NOTE — Telephone Encounter (Signed)
Pt called and said she just realized during her testing this morning she was not tested for HSV 1 and HSV 2. Pt would like that testing to be added to her blood work.

## 2018-02-15 NOTE — Telephone Encounter (Signed)
Added

## 2018-02-16 ENCOUNTER — Encounter: Payer: Self-pay | Admitting: Family Medicine

## 2018-02-16 LAB — HEPATITIS C ANTIBODY: Hep C Virus Ab: <0.1 s/co ratio (ref 0.0–0.9)

## 2018-02-16 LAB — RPR: RPR: NONREACTIVE

## 2018-02-16 LAB — HEPATITIS B SURFACE ANTIGEN: Hepatitis B Surface Ag: NEGATIVE

## 2018-02-16 LAB — HIV ANTIBODY (ROUTINE TESTING W REFLEX): HIV Screen 4th Generation wRfx: NONREACTIVE

## 2018-02-18 DIAGNOSIS — B009 Herpesviral infection, unspecified: Secondary | ICD-10-CM

## 2018-02-18 LAB — URINE CYTOLOGY ANCILLARY ONLY
Chlamydia: NEGATIVE
Neisseria Gonorrhea: NEGATIVE
TRICH (WINDOWPATH): NEGATIVE

## 2018-02-18 MED ORDER — VALACYCLOVIR HCL 1 G PO TABS: 500.0000 mg | ORAL_TABLET | Freq: Every day | ORAL | 3 refills | Status: DC

## 2018-02-19 LAB — HSV(HERPES SIMPLEX VRS) I + II AB-IGG
HSV 1 Glycoprotein G Ab, IgG: 39.4 index — ABNORMAL HIGH (ref 0.00–0.90)
HSV 2 IgG, Type Spec: <0.91 index (ref 0.00–0.90)

## 2018-02-19 LAB — HSV(HERPES SIMPLEX VRS) I + II AB-IGM

## 2018-02-19 MED ORDER — ACYCLOVIR 400 MG PO TABS: 400.0000 mg | ORAL_TABLET | Freq: Two times a day (BID) | ORAL | 3 refills | Status: DC

## 2018-02-19 NOTE — Addendum Note (Signed)
Addended by: Reva BoresPRATT, Niomie Englert S on: 02/19/2018 12:29 PM   Modules accepted: Orders

## 2018-05-21 ENCOUNTER — Telehealth: Payer: Self-pay | Admitting: Family Medicine

## 2018-05-21 NOTE — Telephone Encounter (Signed)
Pt called and she is needs a copy of her Hepatitis  B shot records. ad

## 2018-05-22 NOTE — Telephone Encounter (Signed)
Patient states that she was able to access the results she needed through mychart and printed this off.  Tyron Manetta,CMA

## 2018-06-24 ENCOUNTER — Encounter: Payer: Self-pay | Admitting: Family Medicine

## 2018-06-24 ENCOUNTER — Emergency Department (HOSPITAL_COMMUNITY)
Admission: EM | Admit: 2018-06-24 | Discharge: 2018-06-24 | Disposition: A | Discharge status: Home / Self Care | Payer: Self-pay | Attending: Emergency Medicine | Admitting: Emergency Medicine

## 2018-06-24 ENCOUNTER — Other Ambulatory Visit: Payer: Self-pay

## 2018-06-24 ENCOUNTER — Encounter (HOSPITAL_COMMUNITY): Payer: Self-pay | Admitting: *Deleted

## 2018-06-24 ENCOUNTER — Emergency Department (HOSPITAL_COMMUNITY): Payer: Self-pay

## 2018-06-24 DIAGNOSIS — J069 Acute upper respiratory infection, unspecified: Secondary | ICD-10-CM | POA: Insufficient documentation

## 2018-06-24 DIAGNOSIS — R0602 Shortness of breath: Secondary | ICD-10-CM | POA: Insufficient documentation

## 2018-06-24 DIAGNOSIS — B9789 Other viral agents as the cause of diseases classified elsewhere: Secondary | ICD-10-CM | POA: Insufficient documentation

## 2018-06-24 MED ORDER — FLUTICASONE PROPIONATE 50 MCG/ACT NA SUSP: 1.0000 | Freq: Every day | NASAL | 2 refills | Status: AC

## 2018-06-24 MED ORDER — ALBUTEROL SULFATE (2.5 MG/3ML) 0.083% IN NEBU
5.0000 mg | INHALATION_SOLUTION | Freq: Once | RESPIRATORY_TRACT | Status: AC
  Administered 2018-06-24: 5 mg via RESPIRATORY_TRACT
  Filled 2018-06-24: qty 6

## 2018-06-24 MED ORDER — BENZONATATE 200 MG PO CAPS: 200.0000 mg | ORAL_CAPSULE | Freq: Three times a day (TID) | ORAL | 0 refills | Status: AC | PRN

## 2018-06-24 NOTE — ED Provider Notes (Signed)
[facility]-EMERGENCY DEPT Provider Note   CSN: [id number] Arrival date & time: 06/24/18  1925    History   Chief Complaint Chief Complaint  Patient presents with  . Cough    HPI Christina Wheeler is a 38 y.o. female.     38 year old female presents with cough and congestion x2 to 3 weeks.  Patient states that she is wheezing and feeling slightly short of breath.  Patient is a non-smoker, no history of asthma or chronic lung disease.  Patient has been taking Mucinex and Robitussin but continues to cough.  Patient has not had international travel or contact to regions with high outbreak of coronavirus, no contact with those who have traveled.  Denies fevers, chills     History reviewed. No pertinent past medical history.  There are no active problems to display for this patient.   History reviewed. No pertinent surgical history.   OB History   No obstetric history on file.      Home Medications    Prior to Admission medications   Medication Sig Start Date End Date Taking? Authorizing Provider  albuterol (PROVENTIL HFA;VENTOLIN HFA) 108 (90 BASE) MCG/ACT inhaler Inhale 2 puffs into the lungs every 6 (six) hours as needed for wheezing or shortness of breath. 08/27/13   Rodolph Bong, MD  azithromycin (ZITHROMAX) 250 MG tablet Take 1 tablet (250 mg total) by mouth daily. Take first 2 tablets together, then 1 every day until finished. 08/27/13   Rodolph Bong, MD  benzonatate (TESSALON) 200 MG capsule Take 1 capsule (200 mg total) by mouth 3 (three) times daily as needed for cough. 06/24/18   Jeannie Fend, PA-C  fluticasone (FLONASE) 50 MCG/ACT nasal spray Place 1 spray into both nostrils daily. 06/24/18   Jeannie Fend, PA-C  guaiFENesin-codeine 100-10 MG/5ML syrup Take 5 mLs by mouth at bedtime as needed for cough. 08/27/13   Rodolph Bong, MD  predniSONE (DELTASONE) 10 MG tablet Take 3 tablets (30 mg total) by mouth daily. 08/27/13   Rodolph Bong, MD    Family History No family history on file.  Social History Social History   Tobacco Use  . Smoking status: Never Smoker  . Smokeless tobacco: Never Used  Substance Use Topics  . Alcohol use: Yes  . Drug use: No     Allergies   Penicillins and Mango flavor   Review of Systems Review of Systems  Constitutional: Negative for chills and fever.  HENT: Positive for congestion, postnasal drip, sneezing and sore throat. Negative for ear pain, rhinorrhea, sinus pressure, sinus pain, trouble swallowing and voice change.   Eyes: Negative for discharge and redness.  Respiratory: Positive for cough. Negative for shortness of breath and wheezing.   Musculoskeletal: Negative for arthralgias and myalgias.  Skin: Negative for rash and wound.  Allergic/Immunologic: Negative for immunocompromised state.  Neurological: Negative for weakness and headaches.  Hematological: Negative for adenopathy.  All other systems reviewed and are negative.    Physical Exam Updated Vital Signs BP (!) 138/92 (BP Location: Right Arm)   Pulse (!) 125   Temp 99.2 F (37.3 C) (Oral)   Resp 18   Ht 5\' 7"  (1.702 m)   Wt 90.7 kg   LMP 06/17/2018   SpO2 100%   BMI 31.32 kg/m   Physical Exam Vitals signs and nursing note reviewed.  Constitutional:      General: She is not in acute distress.    Appearance: She is well-developed.  She is not diaphoretic.  HENT:     Head: Normocephalic and atraumatic.     Right Ear: Tympanic membrane and ear canal normal.     Left Ear: Tympanic membrane and ear canal normal.     Nose: Congestion present.     Mouth/Throat:     Mouth: Mucous membranes are moist.     Pharynx: No posterior oropharyngeal erythema.  Eyes:     Conjunctiva/sclera: Conjunctivae normal.  Neck:     Musculoskeletal: Neck supple. No muscular tenderness.  Cardiovascular:     Rate and Rhythm: Normal rate and regular rhythm.     Pulses: Normal pulses.     Heart sounds: Normal heart sounds.   Pulmonary:     Effort: Pulmonary effort is normal.     Breath sounds: Normal breath sounds. No wheezing.  Skin:    General: Skin is warm and dry.     Findings: No rash.  Neurological:     Mental Status: She is alert and oriented to person, place, and time.  Psychiatric:        Behavior: Behavior normal.      ED Treatments / Results  Labs (all labs ordered are listed, but only abnormal results are displayed) Labs Reviewed - No data to display  EKG None  Radiology Dg Chest 2 View  Result Date: 06/24/2018 CLINICAL DATA:  Cough, congestion EXAM: CHEST - 2 VIEW COMPARISON:  08/11/2013 FINDINGS: Heart and mediastinal contours are within normal limits. No focal opacities or effusions. No acute bony abnormality. IMPRESSION: No active cardiopulmonary disease. Electronically Signed   By: Charlett Nose M.D.   On: 06/24/2018 21:01    Procedures Procedures (including critical care time)  Medications Ordered in ED Medications  albuterol (PROVENTIL) (2.5 MG/3ML) 0.083% nebulizer solution 5 mg (5 mg Nebulization Given 06/24/18 2020)     Initial Impression / Assessment and Plan / ED Course  I have reviewed the triage vital signs and the nursing notes.  Pertinent labs & imaging results that were available during my care of the patient were reviewed by me and considered in my medical decision making (see chart for details).  Clinical Course as of Jun 24 2202  Mon Jun 24, 2018  4051 38 year old female with complaint of URI symptoms x2 to 3 weeks.  Lung sounds are clear, patient has boggy nasal membranes otherwise normal exam.  Patient's chest x-ray is unremarkable.  No history of asthma or chronic lung disease, recommend Flonase, Zyrtec, Coricidin HBP due to her elevated blood pressure today, follow-up with PCP, return to ER for severe concerning symptoms.   [LM]    Clinical Course User Index [LM] Jeannie Fend, PA-C   Final Clinical Impressions(s) / ED Diagnoses   Final diagnoses:   Viral URI with cough    ED Discharge Orders         Ordered    fluticasone (FLONASE) 50 MCG/ACT nasal spray  Daily     06/24/18 2151    benzonatate (TESSALON) 200 MG capsule  3 times daily PRN     06/24/18 2151           Jeannie Fend, PA-C 06/24/18 2204    Maia Plan, MD 06/24/18 2356

## 2018-06-24 NOTE — ED Triage Notes (Deleted)
Patient states she has not slept in 3 days. Patient states she feels tired all the time and feels shaky.  

## 2018-06-24 NOTE — ED Triage Notes (Signed)
Pt reports intermittent non-productive cough x 1 month.  She started to have wheezing and body aches today.  No hx of asthma.  Denies smoking or any fever/chills.

## 2018-06-24 NOTE — Discharge Instructions (Addendum)
Saline sinus rinse twice a day.  Flonase twice daily for the first 5 days and continue with daily use.  Take Zyrtec daily. Your blood pressure was elevated today, take Coricidin HBP for additional cold medication symptom relief. Tessalon as needed as prescribed for cough.  Follow-up with your doctor as needed.

## 2018-06-24 NOTE — ED Notes (Signed)
PA notified about HR being elevated and advised to discharge.

## 2018-06-24 NOTE — ED Notes (Signed)
Bed: WTR7 Expected date:  Expected time:  Means of arrival:  Comments: 

## 2018-06-25 ENCOUNTER — Encounter: Payer: Self-pay | Admitting: Family Medicine

## 2018-06-25 ENCOUNTER — Other Ambulatory Visit: Payer: Self-pay

## 2018-06-25 ENCOUNTER — Ambulatory Visit (INDEPENDENT_AMBULATORY_CARE_PROVIDER_SITE_OTHER): Payer: BLUE CROSS/BLUE SHIELD | Admitting: Family Medicine

## 2018-06-25 DIAGNOSIS — H6123 Impacted cerumen, bilateral: Secondary | ICD-10-CM | POA: Diagnosis not present

## 2018-06-25 DIAGNOSIS — IMO0001 Reserved for inherently not codable concepts without codable children: Secondary | ICD-10-CM

## 2018-06-25 DIAGNOSIS — R059 Cough, unspecified: Secondary | ICD-10-CM

## 2018-06-25 DIAGNOSIS — F1020 Alcohol dependence, uncomplicated: Secondary | ICD-10-CM

## 2018-06-25 DIAGNOSIS — F17200 Nicotine dependence, unspecified, uncomplicated: Secondary | ICD-10-CM

## 2018-06-25 DIAGNOSIS — H9203 Otalgia, bilateral: Secondary | ICD-10-CM | POA: Insufficient documentation

## 2018-06-25 DIAGNOSIS — R05 Cough: Secondary | ICD-10-CM

## 2018-06-25 MED ORDER — TETANUS-DIPHTH-ACELL PERTUSSIS 5-2-15.5 LF-MCG/0.5 IM SUSP: 0.5000 mL | Freq: Once | INTRAMUSCULAR | 0 refills | Status: AC

## 2018-06-25 MED ORDER — CIPROFLOXACIN-DEXAMETHASONE 0.3-0.1 % OT SUSP: 4.0000 [drp] | Freq: Two times a day (BID) | OTIC | 0 refills | Status: AC

## 2018-06-25 MED ORDER — VARENICLINE TARTRATE 0.5 MG X 11 & 1 MG X 42 PO MISC: ORAL | 0 refills | Status: DC

## 2018-06-25 NOTE — Patient Instructions (Signed)
Varenicline oral tablets What is this medicine? VARENICLINE (var EN i kleen) is used to help people quit smoking. It is used with a patient support program recommended by your physician. This medicine may be used for other purposes; ask your health care provider or pharmacist if you have questions. COMMON BRAND NAME(S): Chantix What should I tell my health care provider before I take this medicine? They need to know if you have any of these conditions: -heart disease -if you often drink alcohol -kidney disease -mental illness -on hemodialysis -seizures -history of stroke -suicidal thoughts, plans, or attempt; a previous suicide attempt by you or a family member -an unusual or allergic reaction to varenicline, other medicines, foods, dyes, or preservatives -pregnant or trying to get pregnant -breast-feeding How should I use this medicine? Take this medicine by mouth after eating. Take with a full glass of water. Follow the directions on the prescription label. Take your doses at regular intervals. Do not take your medicine more often than directed. There are 3 ways you can use this medicine to help you quit smoking; talk to your health care professional to decide which plan is right for you: 1) you can choose a quit date and start this medicine 1 week before the quit date, or, 2) you can start taking this medicine before you choose a quit date, and then pick a quit date between day 8 and 35 days of treatment, or, 3) if you are not sure that you are able or willing to quit smoking right away, start taking this medicine and slowly decrease the amount you smoke as directed by your health care professional with the goal of being cigarette-free by week 12 of treatment. Stick to your plan; ask about support groups or other ways to help you remain cigarette-free. If you are motivated to quit smoking and did not succeed during a previous attempt with this medicine for reasons other than side effects,  or if you returned to smoking after this treatment, speak with your health care professional about whether another course of this medicine may be right for you. A special MedGuide will be given to you by the pharmacist with each prescription and refill. Be sure to read this information carefully each time. Talk to your pediatrician regarding the use of this medicine in children. This medicine is not approved for use in children. Overdosage: If you think you have taken too much of this medicine contact a poison control center or emergency room at once. NOTE: This medicine is only for you. Do not share this medicine with others. What if I miss a dose? If you miss a dose, take it as soon as you can. If it is almost time for your next dose, take only that dose. Do not take double or extra doses. What may interact with this medicine? -alcohol -insulin -other medicines used to help people quit smoking -theophylline -warfarin This list may not describe all possible interactions. Give your health care provider a list of all the medicines, herbs, non-prescription drugs, or dietary supplements you use. Also tell them if you smoke, drink alcohol, or use illegal drugs. Some items may interact with your medicine. What should I watch for while using this medicine? It is okay if you do not succeed at your attempt to quit and have a cigarette. You can still continue your quit attempt and keep using this medicine as directed. Just throw away your cigarettes and get back to your quit plan. Talk to your health   care provider before using other treatments to quit smoking. Using this medicine with other treatments to quit smoking may increase the risk for side effects compared to using a treatment alone. You may get drowsy or dizzy. Do not drive, use machinery, or do anything that needs mental alertness until you know how this medicine affects you. Do not stand or sit up quickly, especially if you are an older patient.  This reduces the risk of dizzy or fainting spells. Decrease the number of alcoholic beverages that you drink during treatment with this medicine until you know if this medicine affects your ability to tolerate alcohol. Some people have experienced increased drunkenness (intoxication), unusual or sometimes aggressive behavior, or no memory of things that have happened (amnesia) during treatment with this medicine. Sleepwalking can happen during treatment with this medicine, and can sometimes lead to behavior that is harmful to you, other people, or property. Stop taking this medicine and tell your doctor if you start sleepwalking or have other unusual sleep-related activity. After taking this medicine, you may get up out of bed and do an activity that you do not know you are doing. The next morning, you may have no memory of this. Activities include driving a car ("sleep-driving"), making and eating food, talking on the phone, sexual activity, and sleep-walking. Serious injuries have occurred. Stop the medicine and call your doctor right away if you find out you have done any of these activities. Do not take this medicine if you have used alcohol that evening. Do not take it if you have taken another medicine for sleep. The risk of doing these sleep-related activities is higher. Patients and their families should watch out for new or worsening depression or thoughts of suicide. Also watch out for sudden changes in feelings such as feeling anxious, agitated, panicky, irritable, hostile, aggressive, impulsive, severely restless, overly excited and hyperactive, or not being able to sleep. If this happens, call your health care professional. If you have diabetes and you quit smoking, the effects of insulin may be increased and you may need to reduce your insulin dose. Check with your doctor or health care professional about how you should adjust your insulin dose. What side effects may I notice from receiving this  medicine? Side effects that you should report to your doctor or health care professional as soon as possible: -allergic reactions like skin rash, itching or hives, swelling of the face, lips, tongue, or throat -acting aggressive, being angry or violent, or acting on dangerous impulses -breathing problems -changes in emotions or moods -chest pain or chest tightness -feeling faint or lightheaded, falls -hallucination, loss of contact with reality -mouth sores -redness, blistering, peeling or loosening of the skin, including inside the mouth -signs and symptoms of a stroke like changes in vision; confusion; trouble speaking or understanding; severe headaches; sudden numbness or weakness of the face, arm or leg; trouble walking; dizziness; loss of balance or coordination -seizures -sleepwalking -suicidal thoughts or other mood changes Side effects that usually do not require medical attention (report to your doctor or health care professional if they continue or are bothersome): -constipation -gas -headache -nausea, vomiting -strange dreams -trouble sleeping This list may not describe all possible side effects. Call your doctor for medical advice about side effects. You may report side effects to FDA at 1-800-FDA-1088. Where should I keep my medicine? Keep out of the reach of children. Store at room temperature between 15 and 30 degrees C (59 and 86 degrees F). Throw away   any unused medicine after the expiration date. NOTE: This sheet is a summary. It may not cover all possible information. If you have questions about this medicine, talk to your doctor, pharmacist, or health care provider.  2019 Elsevier/Gold Standard (2017-09-21 12:48:08)  

## 2018-06-25 NOTE — Assessment & Plan Note (Addendum)
Attempt made to irrigate without improvement. With verbal consent from patient, she agreed with ear wax removal using soft spatula. This was unsuccessful either. Her left wax is pretty hard and I was unable to insert the spatula. I recommended use of Debrox OTC for 1-2 week and return for reassessment and repeat irrigation. She agreed with the plan. She felt dizzy after irrigation, but resolved after rest. Otherwise no other procedure complication.

## 2018-06-25 NOTE — Assessment & Plan Note (Signed)
Counseling done. She stated that she has cut back a lot and will continue to work on it.

## 2018-06-25 NOTE — Assessment & Plan Note (Addendum)
Likely viral URI. Pulm exam benign. May use OTC cough regimen. F/U as needed.

## 2018-06-25 NOTE — Assessment & Plan Note (Signed)
She has B/L cerumen impaction worse on the left. She is also in pain. I was unable to see her TM, hence unable to r/o infection. If anything otitis externa due to pain with movement of her pinna. I will treat empirically with Ciprodex. Return precaution discussed.

## 2018-06-25 NOTE — Assessment & Plan Note (Signed)
She had cut back a lot. Now on E-cig. I recommended Chantix and she will like to check it. Medication information discussed and given on AVS. F/U soon.

## 2018-06-25 NOTE — Progress Notes (Signed)
Subjective:     Patient ID: Julia May, female   DOB: 07-Feb-1981, 38 y.o.   MRN: [id number]  Otalgia   There is pain in the left ear. This is a new problem. The current episode started in the past 7 days (Started 3 days ago). The problem occurs every few hours. The problem has been waxing and waning. There has been no fever. The pain is at a severity of 0/10 (6/10 in severity 3 days ago. She used peroxide with some improvement). The pain is mild. Pertinent negatives include no coughing, diarrhea, ear discharge, headaches, hearing loss or rash. Associated symptoms comments: Tightness or pressure over her forehead today. No headache. She had reduced hearing in her left ear 3 days ago. This is better now. She has tried NSAIDs and acetaminophen (Peroxide irrigation) for the symptoms. The treatment provided moderate relief. There is no history of a chronic ear infection or hearing loss. Hx recurrent ear infection. Last episode was last summer  ETOH/Tobacco: 3-4 times weekly, liquor 3-4 shots during the week days but more during the weekend. She has cut back. E-cig non-tobacco throughout the week. Trying to cut back.  Cough: Started coughing today, no fever, no SOB.  Health maintenance: Most recent PAP in 2017. She is also due for flu shot and Tdap.  Current Outpatient Medications on File Prior to Visit  Medication Sig Dispense Refill  . acyclovir (ZOVIRAX) 400 MG tablet Take 1 tablet (400 mg total) by mouth 2 (two) times daily. 180 tablet 3  . levonorgestrel (MIRENA) 20 MCG/24HR IUD 1 each by Intrauterine route once. 1 each 0  . valACYclovir (VALTREX) 1000 MG tablet Take 0.5 tablets (500 mg total) by mouth daily. 90 tablet 3   Current Facility-Administered Medications on File Prior to Visit  Medication Dose Route Frequency Provider Last Rate Last Dose  . levonorgestrel (MIRENA) 20 MCG/24HR IUD   Intrauterine Once Pincus Large, DO       Past Medical History:  Diagnosis Date  . Amenorrhea  11/17/2016  . Blurred vision, right eye 11/30/2015     Review of Systems  HENT: Positive for ear pain. Negative for ear discharge and hearing loss.   Respiratory: Negative.  Negative for cough.   Cardiovascular: Negative.   Gastrointestinal: Negative.  Negative for diarrhea.  Genitourinary: Negative.   Skin: Negative for rash.  Neurological: Negative for headaches.  All other systems reviewed and are negative.      Objective:   Physical Exam Vitals signs and nursing note reviewed.  Constitutional:      Appearance: Normal appearance.  HENT:     Head: Normocephalic.     Right Ear: Ear canal normal. No drainage. There is impacted cerumen.     Left Ear: Ear canal normal. Tenderness present. No drainage. There is impacted cerumen.     Ears:     Comments: Bilateral impacted dry cerumen, more on the left. Neck:     Musculoskeletal: Normal range of motion and neck supple.  Cardiovascular:     Rate and Rhythm: Normal rate and regular rhythm.     Pulses: Normal pulses.     Heart sounds: Normal heart sounds. No murmur.  Pulmonary:     Effort: Pulmonary effort is normal. No respiratory distress.     Breath sounds: Normal breath sounds. No stridor. No wheezing or rhonchi.  Abdominal:     General: Abdomen is flat. Bowel sounds are normal. There is no distension.     Palpations: There is  no mass.     Tenderness: There is no abdominal tenderness.  Neurological:     Mental Status: She is alert.        Assessment:     Otalgia Cerumen impaction Tobacco and ETOH dependence/abuse URI HM    Plan:     Check problem list. She declined flu today for health maintenance. I escribed Tdap to her pharmacy. She prefers 3 yrly PAP. Schedule

## 2018-06-26 ENCOUNTER — Other Ambulatory Visit: Payer: Self-pay | Admitting: Family Medicine

## 2018-06-26 ENCOUNTER — Encounter: Payer: Self-pay | Admitting: Family Medicine

## 2018-06-26 DIAGNOSIS — H9203 Otalgia, bilateral: Secondary | ICD-10-CM

## 2018-06-26 DIAGNOSIS — H6123 Impacted cerumen, bilateral: Secondary | ICD-10-CM

## 2018-06-26 DIAGNOSIS — R42 Dizziness and giddiness: Secondary | ICD-10-CM

## 2018-06-27 ENCOUNTER — Encounter: Payer: Self-pay | Admitting: Family Medicine

## 2018-06-27 NOTE — Progress Notes (Signed)
Patient Message Open    06/26/2018 [facility]    Doreene Eland, MD  Family Medicine   Conversation: Visit Follow-Up Question  (Newest Message First)  June 27, 2018        8:15 AM  Julia May routed this conversation to [facility], [place] M  to Me        8:08 AM  I can not find work note on my chart. Not in letters or documents and I can not be home without note. Please help   June 26, 2018  Me  to Julia, May        1:49 PM  It is not normal to still feel dizzy. I will place ENT referral. If dizziness persists or worsen, please go to the ED. I will place work note on MyChart.    Last read by Julia May at 8:06 AM on 06/27/2018.        11:41 AM  Henri Medal, CMA routed this conversation to Me        11:39 AM  Julia May routed this conversation to [employer], [place] M  to Me        11:04 AM  Hey I was just wandering is this normal to still have this much dizziness after my appt yesterday morning? I am currently at work and I can barely focus and can only walk for short periods at a time before having to sit. If needed could i possibly have a note for the rest of the week. Also I was unable to get any of the medications sent. I think it was only a antibiotic for pain of the ear.   This encounter is not signed. The conversation may still be ongoing.  Additional Documentation   Encounter Info:   Billing Info,   History,   Allergies,

## 2018-07-01 ENCOUNTER — Telehealth: Payer: BLUE CROSS/BLUE SHIELD | Admitting: Physician Assistant

## 2018-07-01 DIAGNOSIS — R05 Cough: Secondary | ICD-10-CM

## 2018-07-01 DIAGNOSIS — R059 Cough, unspecified: Secondary | ICD-10-CM

## 2018-07-01 DIAGNOSIS — R112 Nausea with vomiting, unspecified: Secondary | ICD-10-CM

## 2018-07-01 DIAGNOSIS — R197 Diarrhea, unspecified: Secondary | ICD-10-CM

## 2018-07-01 MED ORDER — BENZONATATE 100 MG PO CAPS: 100.0000 mg | ORAL_CAPSULE | Freq: Three times a day (TID) | ORAL | 0 refills | Status: DC | PRN

## 2018-07-01 MED ORDER — ONDANSETRON HCL 4 MG PO TABS: 4.0000 mg | ORAL_TABLET | Freq: Three times a day (TID) | ORAL | 0 refills | Status: DC | PRN

## 2018-07-01 NOTE — Progress Notes (Signed)
E-Visit for Corona Virus Screening  Based on your current symptoms, it seems unlikely that your symptoms are related to the Coronavirus.     Ms. Drey,  As per our conversation, you have had nausea, vomiting, and diarrhea for the past two. You have denied relation of symptoms to eating something specific .The symptoms are improving, with one episode of vomiting in the past 24 hours and 3 episodes of diarrhea, both without blood or mucus. You stated you have been staying hydrated with no problems. You have stated you cough is mild. If your symptoms change, submit another Evisit or follow up with your primary care provider. I have provided the work note.  Coronavirus disease 2019 (COVID-19) is a respiratory illness that can spread from person to person. The virus that causes COVID-19 is a new virus that was first identified in the country of [place] but is now found in multiple other countries and has spread to the [place].  Symptoms associated with the virus are mild to severe fever, cough, and shortness of breath. There is currently no vaccine to protect against COVID-19, and there is no specific antiviral treatment for the virus.   To be considered HIGH RISK for Coronavirus (COVID-19), you have to meet the following criteria:  . Traveled to [place], [place], [place], [place] or [place]; or in the [place] to [place], [place], [place], or [place]; and have fever, cough, and shortness of breath within the last 2 weeks of travel OR  . Been in close contact with a person diagnosed with COVID-19 within the last 2 weeks and have fever, cough, and shortness of breath  . IF YOU DO NOT MEET THESE CRITERIA, YOU ARE CONSIDERED LOW RISK FOR COVID-19.   It is vitally important that if you feel that you have an infection such as this virus or any other virus that you stay home and away from places where you may spread it to others.  You should self-quarantine for 14 days if you have symptoms that  could potentially be coronavirus and avoid contact with people age 63 and older.   You can use medication such as A prescription cough medication called Tessalon Perles 100 mg. You may take 1-2 capsules every 8 hours as needed for cough   I have also sent in Zofran 4mg  , take one pill every 8 hours as needed for nausea/vomiting.   You may also take acetaminophen (Tylenol) as needed for fever.   Reduce your risk of any infection by using the same precautions used for avoiding the common cold or flu:  Marland Kitchen Wash your hands often with soap and warm water for at least 20 seconds.  If soap and water are not readily available, use an alcohol-based hand sanitizer with at least 60% alcohol.  . If coughing or sneezing, cover your mouth and nose by coughing or sneezing into the elbow areas of your shirt or coat, into a tissue or into your sleeve (not your hands). . Avoid shaking hands with others and consider head nods or verbal greetings only. . Avoid touching your eyes, nose, or mouth with unwashed hands.  . Avoid close contact with people who are sick. . Avoid places or events with large numbers of people in one location, like concerts or sporting events. . Carefully consider travel plans you have or are making. . If you are planning any travel outside or inside the [place], visit the CDC's Travelers' Health webpage for the latest health notices. Marland Kitchen  If you have some symptoms but not all symptoms, continue to monitor at home and seek medical attention if your symptoms worsen. . If you are having a medical emergency, call 911.  HOME CARE . Only take medications as instructed by your medical team. . Drink plenty of fluids and get plenty of rest. . A steam or ultrasonic humidifier can help if you have congestion.   GET HELP RIGHT AWAY IF: . You develop worsening fever. . You become short of breath . You cough up blood. . Your symptoms become more severe MAKE SURE YOU   Understand these  instructions.  Will watch your condition.  Will get help right away if you are not doing well or get worse.  Your e-visit answers were reviewed by a board certified advanced clinical practitioner to complete your personal care plan.  Depending on the condition, your plan could have included both over the counter or prescription medications.  If there is a problem please reply once you have received a response from your provider. Your safety is important to [place].  If you have drug allergies check your prescription carefully.    You can use MyChart to ask questions about today's visit, request a non-urgent call back, or ask for a work or school excuse for 24 hours related to this e-Visit. If it has been greater than 24 hours you will need to follow up with your provider, or enter a new e-Visit to address those concerns. You will get an e-mail in the next two days asking about your experience.  I hope that your e-visit has been valuable and will speed your recovery. Thank you for using e-visits.   I have spent 7 min in completion and review of this note- [facility]

## 2018-07-02 ENCOUNTER — Ambulatory Visit: Payer: BLUE CROSS/BLUE SHIELD | Admitting: Family Medicine

## 2018-07-02 ENCOUNTER — Encounter: Payer: Self-pay | Admitting: *Deleted

## 2018-07-17 ENCOUNTER — Encounter: Payer: Self-pay | Admitting: Family Medicine

## 2018-07-23 ENCOUNTER — Encounter: Payer: BLUE CROSS/BLUE SHIELD | Admitting: Family Medicine

## 2018-07-25 ENCOUNTER — Other Ambulatory Visit: Payer: Self-pay | Admitting: Family Medicine

## 2018-08-06 ENCOUNTER — Encounter: Payer: Self-pay | Admitting: Family Medicine

## 2018-08-06 ENCOUNTER — Other Ambulatory Visit: Payer: Self-pay | Admitting: Family Medicine

## 2018-08-06 MED ORDER — CYCLOBENZAPRINE HCL 5 MG PO TABS: 5.0000 mg | ORAL_TABLET | Freq: Two times a day (BID) | ORAL | 0 refills | Status: DC | PRN

## 2018-09-10 ENCOUNTER — Other Ambulatory Visit: Payer: Self-pay

## 2018-09-10 ENCOUNTER — Encounter: Payer: Self-pay | Admitting: Licensed Clinical Social Worker

## 2018-09-10 ENCOUNTER — Ambulatory Visit (INDEPENDENT_AMBULATORY_CARE_PROVIDER_SITE_OTHER): Payer: BC Managed Care – PPO | Admitting: Family Medicine

## 2018-09-10 ENCOUNTER — Ambulatory Visit: Payer: BC Managed Care – PPO | Admitting: Licensed Clinical Social Worker

## 2018-09-10 ENCOUNTER — Encounter: Payer: Self-pay | Admitting: Family Medicine

## 2018-09-10 DIAGNOSIS — F4320 Adjustment disorder, unspecified: Secondary | ICD-10-CM | POA: Insufficient documentation

## 2018-09-10 DIAGNOSIS — F419 Anxiety disorder, unspecified: Secondary | ICD-10-CM

## 2018-09-10 DIAGNOSIS — F4322 Adjustment disorder with anxiety: Secondary | ICD-10-CM

## 2018-09-10 MED ORDER — HYDROXYZINE HCL 50 MG PO TABS: 50.0000 mg | ORAL_TABLET | Freq: Three times a day (TID) | ORAL | 0 refills | Status: DC | PRN

## 2018-09-10 NOTE — Patient Instructions (Signed)
Living With Anxiety  After being diagnosed with an anxiety disorder, you may be relieved to know why you have felt or behaved a certain way. It is natural to also feel overwhelmed about the treatment ahead and what it will mean for your life. With care and support, you can manage this condition and recover from it. How to cope with anxiety Dealing with stress Stress is your body's reaction to life changes and events, both good and bad. Stress can last just a few hours or it can be ongoing. Stress can play a major role in anxiety, so it is important to learn both how to cope with stress and how to think about it differently. Talk with your health care provider or a counselor to learn more about stress reduction. He or she may suggest some stress reduction techniques, such as:  Music therapy. This can include creating or listening to music that you enjoy and that inspires you.  Mindfulness-based meditation. This involves being aware of your normal breaths, rather than trying to control your breathing. It can be done while sitting or walking.  Centering prayer. This is a kind of meditation that involves focusing on a word, phrase, or sacred image that is meaningful to you and that brings you peace.  Deep breathing. To do this, expand your stomach and inhale slowly through your nose. Hold your breath for 3-5 seconds. Then exhale slowly, allowing your stomach muscles to relax.  Self-talk. This is a skill where you identify thought patterns that lead to anxiety reactions and correct those thoughts.  Muscle relaxation. This involves tensing muscles then relaxing them. Choose a stress reduction technique that fits your lifestyle and personality. Stress reduction techniques take time and practice. Set aside 5-15 minutes a day to do them. Therapists can offer training in these techniques. The training may be covered by some insurance plans. Other things you can do to manage stress include:  Keeping a  stress diary. This can help you learn what triggers your stress and ways to control your response.  Thinking about how you respond to certain situations. You may not be able to control everything, but you can control your reaction.  Making time for activities that help you relax, and not feeling guilty about spending your time in this way. Therapy combined with coping and stress-reduction skills provides the best chance for successful treatment. Medicines Medicines can help ease symptoms. Medicines for anxiety include:  Anti-anxiety drugs.  Antidepressants.  Beta-blockers. Medicines may be used as the main treatment for anxiety disorder, along with therapy, or if other treatments are not working. Medicines should be prescribed by a health care provider. Relationships Relationships can play a big part in helping you recover. Try to spend more time connecting with trusted friends and family members. Consider going to couples counseling, taking family education classes, or going to family therapy. Therapy can help you and others better understand the condition. How to recognize changes in your condition Everyone has a different response to treatment for anxiety. Recovery from anxiety happens when symptoms decrease and stop interfering with your daily activities at home or work. This may mean that you will start to:  Have better concentration and focus.  Sleep better.  Be less irritable.  Have more energy.  Have improved memory. It is important to recognize when your condition is getting worse. Contact your health care provider if your symptoms interfere with home or work and you do not feel like your condition is improving. Where   to find help and support: You can get help and support from these sources:  Self-help groups.  Online and [employer] organizations.  A trusted spiritual leader.  Couples counseling.  Family education classes.  Family therapy. Follow these instructions  at home:  Eat a healthy diet that includes plenty of vegetables, fruits, whole grains, low-fat dairy products, and lean protein. Do not eat a lot of foods that are high in solid fats, added sugars, or salt.  Exercise. Most adults should do the following: ? Exercise for at least 150 minutes each week. The exercise should increase your heart rate and make you sweat (moderate-intensity exercise). ? Strengthening exercises at least twice a week.  Cut down on caffeine, tobacco, alcohol, and other potentially harmful substances.  Get the right amount and quality of sleep. Most adults need 7-9 hours of sleep each night.  Make choices that simplify your life.  Take over-the-counter and prescription medicines only as told by your health care provider.  Avoid caffeine, alcohol, and certain over-the-counter cold medicines. These may make you feel worse. Ask your pharmacist which medicines to avoid.  Keep all follow-up visits as told by your health care provider. This is important. Questions to ask your health care provider  Would I benefit from therapy?  How often should I follow up with a health care provider?  How long do I need to take medicine?  Are there any long-term side effects of my medicine?  Are there any alternatives to taking medicine? Contact a health care provider if:  You have a hard time staying focused or finishing daily tasks.  You spend many hours a day feeling worried about everyday life.  You become exhausted by worry.  You start to have headaches, feel tense, or have nausea.  You urinate more than normal.  You have diarrhea. Get help right away if:  You have a racing heart and shortness of breath.  You have thoughts of hurting yourself or others. If you ever feel like you may hurt yourself or others, or have thoughts about taking your own life, get help right away. You can go to your nearest emergency department or call:  Your local emergency services  (911 in the [place]  A suicide crisis helpline, such as the National Suicide Prevention Lifeline at (507)027-94081-423-875-1228. This is open 24-hours a day. Summary  Taking steps to deal with stress can help calm you.  Medicines cannot cure anxiety disorders, but they can help ease symptoms.  Family, friends, and partners can play a big part in helping you recover from an anxiety disorder. This information is not intended to replace advice given to you by your health care provider. Make sure you discuss any questions you have with your health care provider. Document Released: 03/21/2016 Document Revised: 03/21/2016 Document Reviewed: 03/21/2016 Elsevier Interactive Patient Education  [employer]2019 Elsevier Inc.   It was a pleasure seeing you today.   Today we discussed your anxiety  For your anxiety: I have informed our behavioral health team to call you and set up an appointment. I have given you a PRN medicine to be used as needed for anxiety and sleep. IF YOU HAVE THOUGHTS OF HURTING YOURSELF OR OTHERS CALL 911 OR GO TO THE EMERGENCY ROOM.   Please follow up in 1 week with your PCP (you can schedule a virtual visit) or sooner if symptoms persist or worsen. Please call the clinic immediately if you have any concerns.   Our clinic's number is [phone]. Please call  with questions or concerns.   Thank you,  Julia Manis, DO

## 2018-09-10 NOTE — Assessment & Plan Note (Signed)
Patient appears to have acute adjustment disorder related to COVID pandemic.  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  Patient is very interested in speaking with behavioral health that she feels like she is used this in the past.  Sent epic message to Gavin Pound to contact patient.  Patient has requested that she have a virtual visit so that she does not have to leave work.  Patient is very tearful on exam.  She is not sleeping as well.  Is hopeful for a PRN medication so that she can just get over this acute anxiety and continued go to work.  We will try to avoid benzodiazepines given patient's history of substance abuse as well as current alcohol use.  Discussed this with Dr. McDiarmid.  Will give patient Atarax to help with anxiety and sleep.  Hopefully this will help patient through this acute adjustment disorder and help with patient's mood.  Advised that she follow-up with PCP in 1 to 2 weeks to ensure that she is improving.  Strict return precautions given.  Says she feels suicidal she needs to contact 911 or go to emergency department immediately.

## 2018-09-10 NOTE — Progress Notes (Signed)
Subjective:    Patient ID: Julia May, female    DOB: 03/29/1981, 38 y.o.   MRN: [id number]   CC: mental breakdown  HPI: Mental Breakdown Patient coming in because she is concerned that she is having a mental breakdown.  States that this morning she cannot even get out of bed.  Works as a Engineer, civil (consulting)nurse at [employer] and states that it this is too much for her right now.  She works in the triage area so she has to listen to other people's problems all day long which increases her stress.  Given recent COVID pandemic she feels like she is very stressed.  States she feels like she is going crazy.  Office is currently shortstaffed so she is working 3 jobs at once.  Has had issues with anxiety in the past, at that time she had suicidal ideations and was using drugs.  States that she is no longer having any suicidal ideations or homicidal ideations.  States that she does not have a significant support system as her husband does not understand her issues.  Her daughter is young so she cannot talk to her.  States that she is not sleeping, only 4 hours a night from 3 AM to 7 AM.  Has started to drink more alcohol.  Has had a history of prior alcohol use but now only drinks socially.  Yesterday she drank so much that she blacked out yesterday has some marijuana use at nighttime for sleep.  States that she has tried antidepressants in the past but states that these "make me feel like a zombie" and does not like to use them.  She just wants something for the short-term because she is just very stressed during the COVID pandemic.  Objective:  BP 138/82   Pulse 98   SpO2 97%  Vitals and nursing note reviewed  General: well nourished, fall on exam Psych: Patient is very tearful.  Patient is appropriately dressed with hair combed.  Does not appear disheveled.  No scarring.  No suicidal or homicidal ideations.  GAD 7 : Generalized Anxiety Score 09/10/2018  Nervous, Anxious, on Edge 3  Control/stop worrying  2  Worry too much - different things 3  Trouble relaxing 3  Restless 2  Easily annoyed or irritable 3  Afraid - awful might happen 2  Total GAD 7 Score 18  Anxiety Difficulty Somewhat difficult      Assessment & Plan:    Adjustment disorder Patient appears to have acute adjustment disorder related to COVID pandemic.  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  Patient is very interested in speaking with behavioral health that she feels like she is used this in the past.  Sent epic message to Gavin PoundDeborah to contact patient.  Patient has requested that she have a virtual visit so that she does not have to leave work.  Patient is very tearful on exam.  She is not sleeping as well.  Is hopeful for a PRN medication so that she can just get over this acute anxiety and continued go to work.  We will try to avoid benzodiazepines given patient's history of substance abuse as well as current alcohol use.  Discussed this with Dr. McDiarmid.  Will give patient Atarax to help with anxiety and sleep.  Hopefully this will help patient through this acute adjustment disorder and help with patient's mood.  Advised that she follow-up with PCP in 1 to 2 weeks to  ensure that she is improving.  Strict return precautions given.  Says she feels suicidal she needs to contact 911 or go to emergency department immediately.    Return in about 2 weeks (around 09/24/2018).   Oralia Manis, DO, PGY-2

## 2018-09-10 NOTE — BH Specialist Note (Signed)
  Integrated Care Telephone Outreach Note  Start time: 3:55  End time: 4:15 Total time: 20 minutes  Type of Visit: Telephonic Patient location: home LCSW  location: home All persons participating in visit: patient and LCSW Confirmed patient's address: Yes  Confirmed patient's date of birth: Yes  Discussed confidentiality: Yes   "The purpose of this phone visit is to provide behavioral health care while limiting exposure to the coronavirus (COVID19). " "By engaging in this telephone visit, you consent to the provision of healthcare."  Patient consented to telephone visit: Yes  SUBJECTIVE: Julia May is a 38 y.o. female referred by Dr. Darin Engels for symptoms of anxiety during office visit today. Report of symptoms: difficulty sleeping, difficult eating, easily frustrated, crying at times and feeling overwhelmed.  ASSESSMENT: Mood: Euthymic and Affect: Appropriate.  Patient is pleasant and engaged in conversation.  She is currently experiencing symptoms of anxiety which are exacerbated by stress at work, home, and prior trauma that has not been dealt with . Patient may benefit from and is in agreement to receive further assessment and therapeutic interventions to assist with managing her symtpoms. Patient has a history and is currently self medicating to manage her sleep and other symptoms. GOALS: 1. Reduce symptoms of: anxiety 2. Increase knowledge and/or ability of: coping skills, self-management skills and stress reduction   Plan: Patient will 1. call [facility] to schedule an appointment 2.   pickup medication Rx provided today 3.   LCSW will call patient in 3 to 7 days if she has not called. ( call during break 12:30 to 1:30) _______________________________________________________ Duration of CURRENT symptoms:3 months Impact on function:only sleeping 4 hours a night  Psychiatric History - Diagnoses:per patient none - Hospitalizations:  none - Pharmacotherapy: zoloft about 5  years ago, started on hydroxyzine today - Outpatient therapy: [facility] 5 years ago for therapy Current and history of substance QGB:EEFEOFH reports drinking wine and cannabis daily to relax and sleep Risk of harm to self or others: No plan to harm self or others Other:yes  ( did not fully assess details) Resources Utilized in the past: [facility]  LIFE CONTEXT: Family and Social: lives with husband and 63 year old daughter Strengths: willing to seek help so that she will feel better School/Work: works 5 days a week 8 to 5 Life Changes: limitations with Covid-19  INTERVENTION: Client interviewed and appropriate assessments performed. Review of patient's consultants notes from appropriate care team members was performed as part of care coordination referral  Provided client with information about relaxed breathing and discussed mental health treatment options ; also utilized engagement as part of my intervention. as well as SDOH (Social Determinants of Health) screening performed related to challenges with: Alcohol/Substance Use Stress and anxiety.  Sammuel Hines, LCSW [facility]   [phone] 4:58 PM

## 2018-09-13 ENCOUNTER — Telehealth: Payer: Self-pay | Admitting: Licensed Clinical Social Worker

## 2018-09-13 NOTE — Telephone Encounter (Signed)
Care Coordination  Telephone Outreach Note 09/13/2018 Name: Julia May MRN: [id number] DOB: 1980/12/26  Referred by: Dr. Darin Engels for assistance with managing symptoms of anxiety. F/U call to Ms. Blenda Mounts during her lunch break.  Patient reports she has contacted [facility] but they were unable to meet her needs outside of her work hours.  Patient wanted this agency as it is located close to her home.   Current Medications: hydrOXYzine  Patient has been able to get all medications filled as prescribed: Yes Patient is currently taking all medications as prescribed: Yes Patient reports experiencing side effects: No Patient describes feeling this way on medications: sleeping better. Additional patient concerns: locating a therapist   Intervention: Discussed other community therapy resources. Provided client with information about two agencies that have evening and weekend hours.   [facility] and [facility].  Plan: LCSW will F/U with patient in 1 week  Tamela Gammon [facility]   [phone] 2:37 PM

## 2018-09-20 ENCOUNTER — Encounter: Payer: Self-pay | Admitting: Family Medicine

## 2018-09-20 ENCOUNTER — Telehealth: Payer: Self-pay | Admitting: Licensed Clinical Social Worker

## 2018-09-20 NOTE — Telephone Encounter (Signed)
Hello Blue team CMA,  Please call to inform patient that her work letter is ready for pick up. She can print a copy from her "MyChart" account. Thanks.

## 2018-09-20 NOTE — Telephone Encounter (Signed)
Patient made aware via mychart.  Jazmin Hartsell,CMA

## 2018-09-20 NOTE — Telephone Encounter (Signed)
Care Coordination  Telephone Outreach Note 09/20/2018 Name: JAYLANNI ELTRINGHAM MRN: [id number] DOB: 1980/08/01 F/U call to patient ref. Mychart message and to see if she connected with resources provided.  Patient is on her lunch break and able to talk briefly. Patient continues to have a difficult time managing at work.  Continues to feel frustrated and overwhelmed.  She is taking hydrOXYzine at night which helps with sleeping but she doesn't have any relief during the day.  Intervention:Client interviewed and appropriate assessments performed. Discussed coping skills, and community resource options.  Other interventions are: (Mindfulness or Relaxation Training and emotional support.  Plan:   1. Patient will contact [facility] today to schedule their first available appointment.   2. Patient also asked about LCSW writing her a note to be out of work a few days.  Informed patient LCSW would share this request with PCP.   3. Patient will contact PCP to request note for a few days off of work. 4. LCSW will F/U with patient in 1 week.  Kinnie Feil, MD has been notified of this outreach and Ms. Florian Buff Barella's plan.   Casimer Lanius, [facility]   [phone] 12:56 PM

## 2018-09-25 ENCOUNTER — Other Ambulatory Visit: Payer: Self-pay

## 2018-09-25 ENCOUNTER — Encounter: Payer: Self-pay | Admitting: Family Medicine

## 2018-09-25 ENCOUNTER — Encounter: Payer: Self-pay | Admitting: *Deleted

## 2018-09-25 ENCOUNTER — Telehealth (INDEPENDENT_AMBULATORY_CARE_PROVIDER_SITE_OTHER): Payer: BC Managed Care – PPO | Admitting: Family Medicine

## 2018-09-25 DIAGNOSIS — F4322 Adjustment disorder with anxiety: Secondary | ICD-10-CM

## 2018-09-25 NOTE — Progress Notes (Signed)
[facility] Telemedicine Visit  Patient consented to have virtual visit. Method of visit: Telephone  Encounter participants: Patient: Julia May - located at home Provider: [place] [facility] - located at office  Chief Complaint: "I need time off"  HPI:  Julia May is currently going through a lot of stress working 3-4 jobs and was recently told she would need to work twice as hard at 1 of her jobs since her coworker was injured in [facility].  She previously discussed needing a letter to excuse her from work through 09/30/2018 as the increased stress was making her adjustment disorder more difficult to manage.  She is currently awaiting her appointment with a psychiatrist and has been working with our Education officer, museum here at the clinic.  She feels that she needs additional time off through the next week.  She feels that she is otherwise coping well with her adjustment disorder and has no thoughts of SI/HI.  ROS: per HPI  Pertinent PMHx: Reviewed  Exam:  General: Calm and pleasant, talking in complete sentences, able to laugh over conversation Psych: Euthymic mood  Assessment/Plan:  Adjustment disorder Stable, however patient has been having increased stresses in her life, primarily with work.   - Given letter to excuse her through 10/06/2018 - Advised patient to contact clinic in the interim if she has any questions or concerns - Encourage patient to follow-up with psychiatry and PCP    Time spent during visit with patient: 8 minutes

## 2018-09-25 NOTE — Assessment & Plan Note (Signed)
Stable, however patient has been having increased stresses in her life, primarily with work.   - Given letter to excuse her through 10/06/2018 - Advised patient to contact clinic in the interim if she has any questions or concerns - Encourage patient to follow-up with psychiatry and PCP

## 2018-09-27 ENCOUNTER — Telehealth: Payer: Self-pay | Admitting: Licensed Clinical Social Worker

## 2018-09-27 NOTE — Telephone Encounter (Signed)
Care Coordination  Telephone Outreach Note 09/27/2018 Name: Julia May MRN: [id number] DOB: 08-26-1980  F/U check in call to patient.  Provider wrote her out of work for several days.  Patient reports feeling better and sleeping better since being out of work.  Appointment scheduled with [facility] for psychiatry and ongoing therapy June 24th .  Patient appreciative of F/U call from LCSW.   Intervention:Client interviewed and appropriate assessments performed. Review of patient's consultants notes from appropriate care team members was performed as part of care coordination referral.  Discussed relaxation, sleep and self-care.  Plan:  1. Patient will keep appointment at the [facility] June 24th  2. Patient requested LCSW will F/U with her in 1 week.  Casimer Lanius, LCSW [facility]   [phone] 2:13 PM

## 2018-10-04 ENCOUNTER — Ambulatory Visit: Payer: BC Managed Care – PPO

## 2018-10-04 ENCOUNTER — Other Ambulatory Visit: Payer: Self-pay

## 2018-10-04 ENCOUNTER — Telehealth (INDEPENDENT_AMBULATORY_CARE_PROVIDER_SITE_OTHER): Payer: BC Managed Care – PPO | Admitting: Licensed Clinical Social Worker

## 2018-10-04 DIAGNOSIS — Z7189 Other specified counseling: Secondary | ICD-10-CM

## 2018-10-04 NOTE — Telephone Encounter (Signed)
  Case Management  Follow Up Note   10/04/2018 Name: ENID MAULTSBY MRN: [id number] DOB: February 09, 1981  Referred by: Kinnie Feil, MD Reason for referral : Care Coordination  RENAYE JANICKI is a 38 y.o. year old female who is a primary care patient of [place], Phill Myron, MD.  LCSW was consulted for assistance with getting her connect for ongoing therapy and medication management with psychaitry.    F/U call today. Patient is pleasant and engaged in conversation. She attended first appointment at [facility] on June 24th .  Next appointment is July 9th.  She will meet with therapist weekly and will see psychiatry at next visit for medication management.  GOALS: 1. Reduce symptoms of: anxiety and depression 2. Increase knowledge and/or ability of: coping skills, self-management skills and stress reduction  Intervention:Client interviewed and appropriate assessments performed. Discussed community resource options and keeping scheduled appointments. Patient appreciative of assistance from LCSW to connect for ongoing therapy.   Plan: Patient will keep all appointment with [facility].  No additional LCSW services or support needed at this time. Patient will contact [facility] office as needed.  Kinnie Feil, MD has been notified of this outreach and Ms. Florian Buff Welby's plan.   Casimer Lanius, LCSW [facility]   [phone] 1:15 PM

## 2018-10-24 ENCOUNTER — Encounter: Payer: Self-pay | Admitting: Family Medicine

## 2018-12-02 ENCOUNTER — Other Ambulatory Visit: Payer: Self-pay | Admitting: Family Medicine

## 2018-12-02 MED ORDER — HYDROXYZINE HCL 50 MG PO TABS: 50.0000 mg | ORAL_TABLET | Freq: Three times a day (TID) | ORAL | 1 refills | Status: DC | PRN

## 2018-12-03 ENCOUNTER — Encounter: Payer: Self-pay | Admitting: Family Medicine

## 2018-12-09 ENCOUNTER — Encounter: Payer: Self-pay | Admitting: Family Medicine

## 2018-12-26 ENCOUNTER — Ambulatory Visit (INDEPENDENT_AMBULATORY_CARE_PROVIDER_SITE_OTHER): Payer: BC Managed Care – PPO | Admitting: Family Medicine

## 2018-12-26 ENCOUNTER — Other Ambulatory Visit: Payer: Self-pay

## 2018-12-26 VITALS — BP 156/92 | HR 90 | Wt 131.2 lb

## 2018-12-26 DIAGNOSIS — I10 Essential (primary) hypertension: Secondary | ICD-10-CM

## 2018-12-26 DIAGNOSIS — R011 Cardiac murmur, unspecified: Secondary | ICD-10-CM | POA: Diagnosis not present

## 2018-12-26 DIAGNOSIS — Z3009 Encounter for other general counseling and advice on contraception: Secondary | ICD-10-CM

## 2018-12-26 DIAGNOSIS — F4322 Adjustment disorder with anxiety: Secondary | ICD-10-CM | POA: Diagnosis not present

## 2018-12-26 NOTE — Progress Notes (Signed)
Subjective:  Julia May is a 38 y.o. female who [facility] with a chief complaint of high blood pressure.   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  She says she gets a minor headache when her blood pressure gets high.  No chest pain or neurological symptoms.  Adjustment disorder Patient says that she uses the hydroxyzine she has been given for this intermittently, thinks her anxiety and adjustment disorder is reasonably stable.  Says that she uses this a few days a week but not more because it makes her drowsy.  Encounter for counseling regarding contraception Patient is sexually active with a female, currently using the "pullout method ".  Has used IUDs in the past and would like to get another one.    Objective:  Physical Exam: BP (!) 156/92    Pulse 90    Wt 131 lb 3.2 oz (59.5 kg)    LMP 12/15/2018 (Exact Date)    SpO2 99%    BMI 24.00 kg/m   Gen: NAD, conversing comfortably CV: RRR with mild murmur that she says she is known about for quite some time Pulm: NWOB, CTAB with no crackles, wheezes, or rhonchi MSK: no edema, cyanosis, or clubbing noted Skin: warm, dry Neuro: grossly normal, moves all extremities Psych: Normal affect and thought content  No results found for this or any previous visit (from the past 72 hour(s)).   Assessment/Plan:  Adjustment disorder Patient says that she uses the hydroxyzine she has been given for this intermittently, thinks her anxiety and adjustment disorder is reasonably stable.  Says that she uses this a few days a week but not more because it makes her drowsy.  My suggestion was that she try taking half doses of the hydroxyzine to see if she can get a relieving effect without the drowsy impact.  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  She says she gets a minor headache when her blood pressure gets high.  No chest pain or neurological symptoms.  Diagnosing hypertension.  We discussed to be appropriate to consider blood pressure medication at this time but that we would need to check her kidney function so we will get a BMP, as she is a childbearing age female who is sexually active with a female and not using birth control method we discussed contraindication to some medications until she has a long-term plan.  She says she wants to get restarted on IUD, I told her we may offer her a different medication or hold on starting blood pressure medication until she gets to Dr. Lum BabeEniola to get her IUD.    Encounter for counseling regarding contraception Patient is sexually active with a female, currently using the "pullout method ".  Has used IUDs in the past and would like to get another one.  We discussed that she needs to see Dr. Lum BabeEniola soon to get this taken care of because  lack of birth control does impact the blood pressure medicine choices we have  Murmur, cardiac Patient states she has known about this murmur before, describes no changing in tolerance to activity or new swelling of her legs.  Does seem to have newly developing hypertension but no chest pain.  Murmur of unknown etiology at this point, does not seem to be having any clinical impact.  See no reason for emergency follow-up for this, patient can discuss with PCP.   Sherene Sires, DO FAMILY MEDICINE RESIDENT - PGY3 12/27/2018 7:34 AM

## 2018-12-26 NOTE — Patient Instructions (Signed)
It was a pleasure to see you today! Thank you for choosing [facility] for your primary care. Julia May was seen for high blood pressure. Come back to the clinic for your IUD and Pap smear with Dr. Gwendlyn Deutscher.  Today we checked your blood pressure and went over the readings you have been getting at your work.  It does appear that you are having consistent high blood pressures, I do not see any symptoms indicating an emergency at the moment so were going to get a BMP to check your kidney function.  Depending on the results of this we will likely start the lisinopril that we spoke about.  I will send you my chart message to this effect when it happens   Please bring all your medications to every doctors visit   Sign up for My Chart to have easy access to your labs results, and communication with your Primary care physician.     Please check-out at the front desk before leaving the clinic.     Best,  Dr. Sherene Sires FAMILY MEDICINE RESIDENT - PGY3 12/26/2018 4:04 PM

## 2018-12-27 ENCOUNTER — Encounter: Payer: Self-pay | Admitting: Family Medicine

## 2018-12-27 DIAGNOSIS — Z3009 Encounter for other general counseling and advice on contraception: Secondary | ICD-10-CM | POA: Insufficient documentation

## 2018-12-27 DIAGNOSIS — R011 Cardiac murmur, unspecified: Secondary | ICD-10-CM | POA: Insufficient documentation

## 2018-12-27 DIAGNOSIS — I10 Essential (primary) hypertension: Secondary | ICD-10-CM | POA: Insufficient documentation

## 2018-12-27 LAB — BASIC METABOLIC PANEL
BUN/Creatinine Ratio: 17 (ref 9–23)
BUN: 14 mg/dL (ref 6–20)
CO2: 24 mmol/L (ref 20–29)
Calcium: 9 mg/dL (ref 8.7–10.2)
Chloride: 101 mmol/L (ref 96–106)
Creatinine, Ser: 0.82 mg/dL (ref 0.57–1.00)
GFR calc Af Amer: 106 mL/min/{1.73_m2} (ref >59)
GFR calc non Af Amer: 92 mL/min/{1.73_m2} (ref >59)
Glucose: 81 mg/dL (ref 65–99)
Potassium: 4.4 mmol/L (ref 3.5–5.2)
Sodium: 138 mmol/L (ref 134–144)

## 2018-12-27 MED ORDER — LABETALOL HCL 100 MG PO TABS: 100.0000 mg | ORAL_TABLET | Freq: Two times a day (BID) | ORAL | 0 refills | Status: DC

## 2018-12-27 NOTE — Assessment & Plan Note (Signed)
Patient says that she uses the hydroxyzine she has been given for this intermittently, thinks her anxiety and adjustment disorder is reasonably stable.  Says that she uses this a few days a week but not more because it makes her drowsy.  My suggestion was that she try taking half doses of the hydroxyzine to see if she can get a relieving effect without the drowsy impact.  She will try this and discuss this with her PCP once she sees how it impacts her care

## 2018-12-27 NOTE — Telephone Encounter (Signed)
Your blood test came back well and you seem to have good kidney function still.  I am sending in a prescription for a low-dose labetalol which she will take twice per day.  If you start noticing that you are having any problems with his medicine please stop immediately and call our office.  I see that you are currently scheduled to see Dr. Gwendlyn Deutscher later this month, once you get your IUD placed you all can have a discussion about potentially changing to the lisinopril that we had spoken about.  -Dr. Criss Rosales

## 2018-12-27 NOTE — Assessment & Plan Note (Signed)
Patient states she has known about this murmur before, describes no changing in tolerance to activity or new swelling of her legs.  Does seem to have newly developing hypertension but no chest pain.  Murmur of unknown etiology at this point, does not seem to be having any clinical impact.  See no reason for emergency follow-up for this, patient can discuss with PCP.

## 2018-12-27 NOTE — Assessment & Plan Note (Signed)
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  She says she gets a minor headache when her blood pressure gets high.  No chest pain or neurological symptoms.  Diagnosing hypertension.  We discussed to be appropriate to consider blood pressure medication at this time but that we would need to check her kidney function so we will get a BMP, as she is a childbearing age female who is sexually active with a female and not using birth control method we discussed contraindication to some medications until she has a long-term plan.  She says she wants to get restarted on IUD, I told her we may offer her a different medication or hold on starting blood pressure medication until she gets to Dr. Gwendlyn Deutscher to get her IUD.

## 2018-12-27 NOTE — Assessment & Plan Note (Signed)
Patient is sexually active with a female, currently using the "pullout method ".  Has used IUDs in the past and would like to get another one.  We discussed that she needs to see Dr. Gwendlyn Deutscher soon to get this taken care of because lack of birth control does impact the blood pressure medicine choices we have

## 2019-01-02 ENCOUNTER — Telehealth: Payer: Self-pay | Admitting: Family Medicine

## 2019-01-02 NOTE — Telephone Encounter (Signed)
Unable to reach patient. HIPAA compliant callback message left.  Note: She is scheduled to see me on 01/07/19 for BP management as well as PAP and IUD insertion.  I wanted to clarify whether she wanted to discuss IUD or will like to get it placed during her visit.  I have blocked two appointment slots for PAP and IUD.  If she will not be getting placement, then we can keep one slot for her appointment.  If she is unable to make the appointment, she should cancel 24 hours ahead so that other patient can use those slots.

## 2019-01-02 NOTE — Telephone Encounter (Signed)
Pt returned call.  She DOES desire to have IUD placed that day.  Schedule left blocked. Christen Bame, CMA

## 2019-01-03 ENCOUNTER — Encounter: Payer: Self-pay | Admitting: Family Medicine

## 2019-01-06 ENCOUNTER — Encounter: Payer: Self-pay | Admitting: Family Medicine

## 2019-01-07 ENCOUNTER — Encounter: Payer: Self-pay | Admitting: Family Medicine

## 2019-01-07 ENCOUNTER — Other Ambulatory Visit: Payer: Self-pay

## 2019-01-07 ENCOUNTER — Other Ambulatory Visit (HOSPITAL_COMMUNITY)
Admission: RE | Admit: 2019-01-07 | Discharge: 2019-01-07 | Disposition: A | Discharge status: Home / Self Care | Payer: BC Managed Care – PPO | Source: Ambulatory Visit | Attending: Family Medicine | Admitting: Family Medicine

## 2019-01-07 ENCOUNTER — Ambulatory Visit (INDEPENDENT_AMBULATORY_CARE_PROVIDER_SITE_OTHER): Payer: BC Managed Care – PPO | Admitting: Family Medicine

## 2019-01-07 VITALS — BP 122/84 | HR 100 | Ht 62.0 in | Wt 134.5 lb

## 2019-01-07 DIAGNOSIS — Z30430 Encounter for insertion of intrauterine contraceptive device: Secondary | ICD-10-CM | POA: Diagnosis not present

## 2019-01-07 DIAGNOSIS — Z01419 Encounter for gynecological examination (general) (routine) without abnormal findings: Secondary | ICD-10-CM

## 2019-01-07 DIAGNOSIS — Z3009 Encounter for other general counseling and advice on contraception: Secondary | ICD-10-CM

## 2019-01-07 DIAGNOSIS — I10 Essential (primary) hypertension: Secondary | ICD-10-CM | POA: Diagnosis not present

## 2019-01-07 DIAGNOSIS — N898 Other specified noninflammatory disorders of vagina: Secondary | ICD-10-CM

## 2019-01-07 DIAGNOSIS — R011 Cardiac murmur, unspecified: Secondary | ICD-10-CM

## 2019-01-07 LAB — POCT WET PREP (WET MOUNT)
Clue Cells Wet Prep Whiff POC: NEGATIVE
Trichomonas Wet Prep HPF POC: ABSENT

## 2019-01-07 LAB — POCT URINE PREGNANCY: Preg Test, Ur: NEGATIVE

## 2019-01-07 MED ORDER — ACETAMINOPHEN 325 MG PO TABS: 500.0000 mg | ORAL_TABLET | Freq: Once | ORAL | Status: DC

## 2019-01-07 MED ORDER — ACETAMINOPHEN 500 MG PO TABS
500.0000 mg | ORAL_TABLET | Freq: Once | ORAL | Status: AC
  Administered 2019-01-07: 500 mg via ORAL

## 2019-01-07 MED ORDER — ACETAMINOPHEN 500 MG PO CAPS: 500.0000 mg | ORAL_CAPSULE | Freq: Once | ORAL | Status: DC

## 2019-01-07 MED ORDER — LEVONORGESTREL 20 MCG/24HR IU IUD
1.0000 | INTRAUTERINE_SYSTEM | Freq: Once | INTRAUTERINE | Status: AC
  Administered 2019-01-07: 1 via INTRAUTERINE

## 2019-01-07 NOTE — Addendum Note (Signed)
Addended by: Christen Bame D on: 01/07/2019 01:02 PM   Modules accepted: Orders

## 2019-01-07 NOTE — Addendum Note (Signed)
Addended by: Andrena Mews T on: 01/07/2019 01:51 PM   Modules accepted: Orders

## 2019-01-07 NOTE — Progress Notes (Signed)
Subjective:     Patient ID: Julia May, female   DOB: April 04, 1981, 38 y.o.   MRN: [id number]  HPI PAP/IUD insertion: LMP was 12/15/18 and regular. She had IUD twice before. The last time she tried to insert it, it was difficult. Hence the procedure was discontinued. Since then she has been practicing withdrawal birth control.  Sexually active.  Current Outpatient Medications on File Prior to Visit  Medication Sig Dispense Refill  . hydrOXYzine (ATARAX/VISTARIL) 50 MG tablet Take 1 tablet (50 mg total) by mouth every 8 (eight) hours as needed for anxiety. Do not use when operating machinery or driving 90 tablet 1  . labetalol (NORMODYNE) 100 MG tablet Take 1 tablet (100 mg total) by mouth 2 (two) times daily. 60 tablet 0  . valACYclovir (VALTREX) 1000 MG tablet Take 0.5 tablets (500 mg total) by mouth daily. 90 tablet 3  . cyclobenzaprine (FLEXERIL) 5 MG tablet Take 1 tablet (5 mg total) by mouth 2 (two) times daily as needed for muscle spasms. (Patient not taking: Reported on 01/07/2019) 20 tablet 0   No current facility-administered medications on file prior to visit.    Past Medical History:  Diagnosis Date  . Amenorrhea 11/17/2016  . ANEMIA, IRON DEFICIENCY, UNSPEC. 06/07/2006   Qualifier: Diagnosis of  By: Gavin Potters MD, HEIDI    . Blurred vision, right eye 11/30/2015   Vitals:   01/07/19 0932  BP: 122/84  Pulse: 100  SpO2: 98%  Weight: 134 lb 8 oz (61 kg)  Height: 5\' 2"  (1.575 m)     Review of Systems  Respiratory: Negative.   Cardiovascular: Negative.   Gastrointestinal: Negative.   Genitourinary: Negative.   Musculoskeletal: Negative.   All other systems reviewed and are negative.      Objective:   Physical Exam Vitals signs and nursing note reviewed. Exam conducted with a chaperone present Legette).  Cardiovascular:     Rate and Rhythm: Normal rate and regular rhythm.     Pulses: Normal pulses.     Heart sounds: Normal heart sounds. No murmur. No gallop.    Pulmonary:     Effort: Pulmonary effort is normal. No respiratory distress.     Breath sounds: Normal breath sounds. No stridor. No wheezing.  Abdominal:     General: Abdomen is flat. Bowel sounds are normal. There is no distension.     Palpations: There is no mass.     Tenderness: There is no abdominal tenderness. There is no guarding.  Genitourinary:    Exam position: Lithotomy position.     Labia:        Right: No tenderness or injury.        Left: No tenderness or injury.      Vagina: Normal.     Cervix: Normal.     Uterus: Normal.      Adnexa: Right adnexa normal.     Comments: Labia minora skin with some area of depigmentation (looks like vitiligo).  Few creamy discharge Musculoskeletal:     Right lower leg: No edema.     Left lower leg: No edema.  Neurological:     Mental Status: She is alert.        Assessment:     Gyn exam Contraceptive management HTN Vaginal discharge    Plan:     Normal Gyne exam. PAP completed. She prefers 3 yearly PAP check up with co-testing. Mirena inserted today. F/U in 4 weeks for string check.  VD may be normal.  Wet prep  And GC/Chlamydia completed. I will contact her with the result.   IUD Intrauterine Device Insertion Procedure Note  PRE-OP DIAGNOSIS: desired long-term, reversible contraception   POST-OP DIAGNOSIS: Same   PROCEDURE: IUD placement  Performing Physician: Gwendlyn Deutscher, MD  Supervising Physician (if applicable): N/A   Checklist: Multiple Partners   [_] Yes     [X]  No Copper Allergy   [_] Yes     [X]  No PID/STD    [_] Yes     [X]  No Ectopic Pregnancy   [_] Yes     [X]  No Breastfeeding    [_] Yes     [X]  No   IUD type: [X]  Mirena   [_] Paraguard  S/N: [id number] Exp: June 2022 Lot: [id number]   Verbal and written consent obtained.  PROCEDURE:  Timeout procedure was performed to ensure right patient and right site.  A bimanual exam was performed to determine the position of the uterus. The speculum  was placed. The vagina and cervix was sterilized in the usual manner and sterile technique was maintained throughout the course of the procedure. A single toothed tenaculum was applied to the anterior lip of the cervix and gentle traction applied to straighten and stabilize it. The depth of the uterus was sounded to be of appropriate depth (usually 7cm). With gentle traction on the tenaculum, the IUD was inserted to the appropriate depth and deposited by withdrawing on the insertion tube holding the rod steady.  The string was cut to an estimated 2 cm length. Bleeding was minimal. The patient tolerated the procedure well.   Tylenol was given for mild pain post procedure.   Followup: The patient tolerated the procedure well without complications.  Standard post-procedure care is explained and return precautions are given.  Marland Kitchen

## 2019-01-07 NOTE — Patient Instructions (Signed)

## 2019-01-07 NOTE — Assessment & Plan Note (Signed)
BP looks good today on Labetalol. Continue current regimen.

## 2019-01-07 NOTE — Addendum Note (Signed)
Addended by: Andrena Mews T on: 01/07/2019 10:20 AM   Modules accepted: Orders

## 2019-01-07 NOTE — Assessment & Plan Note (Signed)
No murmur on exam today.  Denies cardiac symptoms. Will resolve this.

## 2019-01-07 NOTE — Addendum Note (Signed)
Addended by: Salvatore Marvel on: 01/07/2019 01:55 PM   Modules accepted: Orders

## 2019-01-07 NOTE — Addendum Note (Signed)
Addended by: Andrena Mews T on: 01/07/2019 10:24 AM   Modules accepted: Orders

## 2019-01-13 ENCOUNTER — Ambulatory Visit: Payer: Self-pay | Admitting: Emergency Medicine

## 2019-01-14 LAB — CYTOLOGY - PAP
Chlamydia: NEGATIVE
Diagnosis: NEGATIVE
Diagnosis: REACTIVE
High risk HPV: NEGATIVE
Neisseria Gonorrhea: NEGATIVE
Trichomonas: NEGATIVE

## 2019-01-15 ENCOUNTER — Telehealth: Payer: Self-pay | Admitting: Family Medicine

## 2019-01-15 NOTE — Telephone Encounter (Signed)
PAP and STD result discussed with her.  No question raised by her.

## 2019-01-21 ENCOUNTER — Other Ambulatory Visit: Payer: Self-pay | Admitting: Family Medicine

## 2019-01-21 DIAGNOSIS — I10 Essential (primary) hypertension: Secondary | ICD-10-CM

## 2019-02-05 ENCOUNTER — Other Ambulatory Visit: Payer: Self-pay | Admitting: Family Medicine

## 2019-02-05 ENCOUNTER — Encounter: Payer: Self-pay | Admitting: Family Medicine

## 2019-02-05 MED ORDER — IBUPROFEN 400 MG PO TABS: 400.0000 mg | ORAL_TABLET | Freq: Three times a day (TID) | ORAL | 0 refills | Status: AC | PRN

## 2019-02-17 ENCOUNTER — Encounter: Payer: Self-pay | Admitting: Family Medicine

## 2019-02-19 DIAGNOSIS — Z20828 Contact with and (suspected) exposure to other viral communicable diseases: Secondary | ICD-10-CM | POA: Diagnosis not present

## 2019-02-19 DIAGNOSIS — Z6823 Body mass index (BMI) 23.0-23.9, adult: Secondary | ICD-10-CM | POA: Diagnosis not present

## 2019-02-20 ENCOUNTER — Encounter: Payer: Self-pay | Admitting: Family Medicine

## 2019-02-20 ENCOUNTER — Other Ambulatory Visit: Payer: Self-pay

## 2019-02-20 ENCOUNTER — Telehealth (INDEPENDENT_AMBULATORY_CARE_PROVIDER_SITE_OTHER): Payer: BC Managed Care – PPO | Admitting: Family Medicine

## 2019-02-20 DIAGNOSIS — J069 Acute upper respiratory infection, unspecified: Secondary | ICD-10-CM | POA: Diagnosis not present

## 2019-02-20 MED ORDER — ALBUTEROL SULFATE HFA 108 (90 BASE) MCG/ACT IN AERS: 2.0000 | INHALATION_SPRAY | RESPIRATORY_TRACT | 1 refills | Status: DC | PRN

## 2019-02-20 NOTE — Progress Notes (Signed)
[facility] Telemedicine Visit  Patient consented to have virtual visit. Method of visit: Telephone  Encounter participants: Patient: Julia May - located at home Provider: [place] Kazuki Ingle - located at [facility]  Others (if applicable): n/a  Chief Complaint: Since Friday to Saturday  HPI:  Patient reports that she had a negative Covid test yesterday however since Friday patient has been having body aches and headaches.  She states that she has not having much trouble breathing but is endorsing a cough.  She reports that she has been told to go back to work after her negative test.  However she states that she feels as if she has the flu or some other cold.  She has no known sick contacts.  She reports occasional fevers but is currently fever free.  She denies any chest pain.  She has previously used an albuterol inhaler for shortness of breath and has used that periodically throughout this illness since Friday, however she states that hers is currently out.  Given that it is currently a pandemic I recommended the patient stay home.  See plan below.  ROS: per HPI  Pertinent PMHx: HTN  Exam:  Respiratory: able to speak in complete sentences without issue  Assessment/Plan:  URI symptoms -ED precautions discussed and patient expressed good understanding -Patient counseled on wearing a mask, washing hands -Patient instructed to avoid others until she meets criteria for ending isolation after any suspected COVID, which are:  -3 days with no fever and  -Respiratory symptoms have improved (e.g. cough, shortness of breath) or  -10 days since symptoms first appeared  -Patient encouraged to stay home from work and given note to stay out until Monday given that it is currently a pandemic. -Otherwise supportive management encouraged and albuterol inhaler refill provided.  Honey for cough.  Drink plenty of fluids. -Strict return precautions discussed, patient voiced  understanding  Time spent during visit with patient: 7 minutes  [place] Yukiko Minnich, DO PGY-3, [place]

## 2019-02-28 ENCOUNTER — Encounter: Payer: Self-pay | Admitting: Family Medicine

## 2019-04-07 ENCOUNTER — Encounter: Payer: Self-pay | Admitting: Family Medicine

## 2019-05-13 ENCOUNTER — Other Ambulatory Visit: Payer: Self-pay | Admitting: *Deleted

## 2019-05-13 DIAGNOSIS — B009 Herpesviral infection, unspecified: Secondary | ICD-10-CM

## 2019-05-21 ENCOUNTER — Telehealth: Payer: Self-pay | Admitting: Family Medicine

## 2019-05-21 ENCOUNTER — Telehealth: Payer: Self-pay

## 2019-05-21 DIAGNOSIS — B009 Herpesviral infection, unspecified: Secondary | ICD-10-CM

## 2019-05-21 NOTE — Telephone Encounter (Addendum)
   Please help patient schedule a virtual appointment to get evaluated before we can provide a letter. Thanks. Please let this patient know that her Valtrex refill was canceled, because there is no documentation of recurrence. Please get her on virtual appointment to assess for Herpes and her COVID-19 symptoms. Thanks.

## 2019-05-21 NOTE — Telephone Encounter (Signed)
Patient calls nurse line regarding cough and sore throat. Patient states symptom onset began on Monday. Also reports loss of taste. No fever or body aches or known exposure. However, patient did attend super bowl party over the weekend. Patient reports wearing mask during event, however, not everyone in attendance was.   Patient works at [employer] Advised patient to schedule COVID testing and to quarantine until results come back. Patient requests note for work to excuse absence.   Patient reports no difficulty breathing, SHOB, or chest pain. ED precautions given.   Patient also states that she needs refill on Valtrex.   Veronda Prude, RN

## 2019-05-21 NOTE — Telephone Encounter (Signed)
Please let this patient know that her Valtrex refill was canceled, because there is no documentation of recurrence. Please get her on virtual appointment to assess for Herpes and her COVID-19 symptoms. Thanks.

## 2019-05-21 NOTE — Telephone Encounter (Signed)
Virtual appointment scheduled for tomorrow AM at [phone].   ED precautions reinforced.   Veronda Prude, RN

## 2019-05-22 ENCOUNTER — Telehealth (INDEPENDENT_AMBULATORY_CARE_PROVIDER_SITE_OTHER): Payer: BC Managed Care – PPO | Admitting: Family Medicine

## 2019-05-22 ENCOUNTER — Ambulatory Visit: Payer: BC Managed Care – PPO | Attending: Internal Medicine

## 2019-05-22 ENCOUNTER — Other Ambulatory Visit: Payer: Self-pay

## 2019-05-22 DIAGNOSIS — J069 Acute upper respiratory infection, unspecified: Secondary | ICD-10-CM

## 2019-05-22 DIAGNOSIS — Z20822 Contact with and (suspected) exposure to covid-19: Secondary | ICD-10-CM | POA: Diagnosis not present

## 2019-05-22 NOTE — Progress Notes (Signed)
[facility] Telemedicine Visit  Patient consented to have virtual visit. Method of visit: Telephone She was unable to open the link for the video encounter.  Encounter participants: Patient: Julia May - located at work Provider: Mirian Mo - located at [facility] Others (if applicable): none  Chief Complaint: concern for COVID  HPI:  Concern for Covid infection Julia May is concerned she may have a Covid infection.  She reports spending time with a large group of people during the [employer] [place] days ago.  Since then, she has developed significant body aches and is not able to taste her food.  She is also noted some mild shortness of breath and a cough.  She has been using her inhaler slightly more than usual.  She reports using her inhaler 3 times yesterday.  She would like a note excusing her from work.  She currently works as a Clinical biochemist at [employer].  ROS: per HPI  Pertinent PMHx: Active smoker.  Exam:  Respiratory: Breathing comfortably on room air during our conversation.  Able to complete long sentences without effort.  She did experience significant coughing during our conversation.  Assessment/Plan:  Concern for Covid infection She is scheduled to have a Covid test done this afternoon.  She was encouraged to quarantine at home until she found her test results.  If she is positive, she was informed she would need to isolate at home for 10 days from symptom onset. -Tylenol for fever/discomfort -Motrin for discomfort -Work note sent -Follow-up with any new concerns.  Time spent during visit with patient: 15 minutes

## 2019-05-23 LAB — NOVEL CORONAVIRUS, NAA: SARS-CoV-2, NAA: NOT DETECTED

## 2019-09-04 ENCOUNTER — Other Ambulatory Visit: Payer: Self-pay

## 2019-09-04 ENCOUNTER — Encounter: Payer: Self-pay | Admitting: Family Medicine

## 2019-09-04 ENCOUNTER — Ambulatory Visit (INDEPENDENT_AMBULATORY_CARE_PROVIDER_SITE_OTHER): Payer: Self-pay | Admitting: Family Medicine

## 2019-09-04 VITALS — BP 160/92 | HR 85 | Wt 117.6 lb

## 2019-09-04 DIAGNOSIS — H6122 Impacted cerumen, left ear: Secondary | ICD-10-CM

## 2019-09-04 NOTE — Progress Notes (Signed)
    SUBJECTIVE:   CHIEF COMPLAINT / HPI:   Left Ear Pressure Patient endorses 2 weeks of left ear pressure and feels like her left ear is clogged. She has had this before and had her ears irrigated and stated that it helped tremendously. She denies ear pain or hearing loss but does state it feels like her left ear is "full" and does have some pressure. Her hearing is more muted on that side. She has has some discharge from the ear that she describes as "white" and has been trying to remove the wax and "picking at it" which has caused minor bleeding that has resolved. She has tried OTC debrox and it is not working.  Of note, patient given PHQ-2, score a 3, then given PHQ-9 and scored 16. She said she feels fine and sometimes just has "spells" and it comes and goes and just gets better on its own. She states this occurs to her for many years but she can easily cope with it by having time to relax and time to herself. Encouraged her to return to clinic to discuss further if she feels unable to cope. She expressed understanding, appreciation, and agreement.  PERTINENT  PMH / PSH: Tobacco use disorder, HTN  OBJECTIVE:   BP (!) 160/92   Pulse 85   Wt 117 lb 9.6 oz (53.3 kg)   SpO2 99%   BMI 21.51 kg/m   Gen: NAD Ears: Right ear canal clear with minimal cerumen, TM clear with good light reflex. Left ear canal no erythematous or swollen. TM not visible due to cerumen impaction.  ASSESSMENT/PLAN:   Cerumen impaction Left ear irrigated and patient tolerated it well. She had immediate resolution of her symptoms of left ear pressure and muted hearing - F/u as needed     Arlyce Harman, DO [facility]

## 2019-09-04 NOTE — Assessment & Plan Note (Signed)
Left ear irrigated and patient tolerated it well. She had immediate resolution of her symptoms of left ear pressure and muted hearing - F/u as needed

## 2019-09-04 NOTE — Patient Instructions (Signed)
Ear Irrigation °Ear irrigation is a procedure to wash dirt and wax out of your ear canal. This procedure is also called lavage. You may need ear irrigation if you are having trouble hearing because of a buildup of earwax. You may also have ear irrigation as part of the treatment for an ear infection. Getting wax and dirt out of your ear canal can help some medicines (ear drops) work better. °How is ear irrigation performed? °The procedure may vary among health care providers and hospitals. In general: °· You may be given ear drops to put in your ear 15-20 minutes before irrigation. This helps loosen the wax. °· A syringe containing water or a sterile salt solution (saline) can be gently inserted into the ear canal. The saline is used to flush out wax and other debris. °Ear irrigation kits are also available for use at home. Ask your health care provider if this is an option for you. Use a home irrigation kit only as told by your health care provider. Read the package instructions carefully. Follow the directions for using the syringe. Use water that is room temperature. °Do not do ear irrigation at home if you: °· Have diabetes. Diabetes increases the risk of infection. °· Have a hole or tear in your eardrum. °· Have tubes in your ears. °· Have had any ear surgery in the past. °· Have been instructed not to irrigate your ears. °What are the risks of ear irrigation? °Generally, this is a safe procedure. However, problems may occur, including: °· Infection. °· Pain. °· Hearing loss. °· Pushing water and debris into the eardrum. This can occur if there are holes in the eardrum. °· Ear irrigation failing to work. °How should I care for my ears after irrigation? °After an ear irrigation, follow instructions given to you by your healthcare provider. °Cleaning ° °· Clean the outside of your ear with a soft washcloth daily. °· If told by your health care provider, use a few drops of baby oil, mineral oil, glycerin, hydrogen  peroxide, or over-the-counter earwax softening drops. °· Do not use cotton swabs to clean your ears. These can push wax down into the ear canal. °· Do not put anything into your ears to try to remove wax. This includes ear candles. °General instructions °· Take over-the-counter and prescription medicines only as told by your health care provider. °· If you were prescribed an antibiotic medicine, use it as told by your health care provider. Do not stop using the antibiotic even if your condition improves. °· Keep all follow-up visits as told by your health care provider. This is important. °· Visit your health care provider at least once a year to have your ears and hearing checked. °Follow these instructions at home: °· Keep the ear clean and dry by following the instructions from your healthcare provider. °Contact a health care provider if: °· Your hearing is not improving or is getting worse. °· You have pain or redness in your ear. °· You are dizzy. °· You have ringing in your ears. °· You have nausea or vomiting. °· You have fluid, blood, or pus coming out of your ear. °Summary °· Ear irrigation is a procedure to wash dirt and wax out of your ear canal. This procedure is also called lavage. °· To perform ear irrigation, ear drops may be put in your ear 15-20 minutes before irrigation. Water or sterile salt solution (saline) will be used to flush out wax and other debris. °· You   may be able to irrigate your ears at home. Ask your health care provider if this is an option for you. Follow your health care provider's instructions. °· Clean your ears with a soft cloth after irrigation. Do not use cotton swabs to clean your ears. These can push wax down into the ear canal. °This information is not intended to replace advice given to you by your health care provider. Make sure you discuss any questions you have with your health care provider. °Document Revised: 12/24/2017 Document Reviewed: 12/24/2017 °Elsevier Patient  Education © 2020 Elsevier Inc. ° °

## 2019-10-29 ENCOUNTER — Ambulatory Visit (INDEPENDENT_AMBULATORY_CARE_PROVIDER_SITE_OTHER): Payer: No Typology Code available for payment source | Admitting: Family Medicine

## 2019-10-29 ENCOUNTER — Other Ambulatory Visit: Payer: Self-pay

## 2019-10-29 ENCOUNTER — Encounter: Payer: Self-pay | Admitting: Family Medicine

## 2019-10-29 VITALS — BP 138/102 | HR 92 | Ht 62.0 in | Wt 122.0 lb

## 2019-10-29 DIAGNOSIS — R238 Other skin changes: Secondary | ICD-10-CM

## 2019-10-29 DIAGNOSIS — S8012XA Contusion of left lower leg, initial encounter: Secondary | ICD-10-CM | POA: Diagnosis not present

## 2019-10-29 DIAGNOSIS — K0889 Other specified disorders of teeth and supporting structures: Secondary | ICD-10-CM

## 2019-10-29 DIAGNOSIS — R233 Spontaneous ecchymoses: Secondary | ICD-10-CM

## 2019-10-29 MED ORDER — NAPROXEN 500 MG PO TABS: 500.0000 mg | ORAL_TABLET | Freq: Two times a day (BID) | ORAL | 0 refills | Status: DC

## 2019-10-29 NOTE — Patient Instructions (Addendum)
It was good to see you today.  Thank you for coming in.  I think you have normal bruising.      We wish to rule other causes out such as issues with clotting, or issues with liver enzymes given past alcohol use, so we are obtaining labs.  We also are prescribing Naproxen 500 mg to be taken twice a day for 2 weeks for pain before your dental visit.  If you need a refill or run out before seeing dentist please call us.  If either of these problems become significantly worse, please come back and see [place].  Be Well, Dr Jovita Kussmaul   Therapy and Counseling Resources Most providers on this list will take Medicaid. Patients with commercial insurance or Medicare should contact their insurance company to get a list of in network providers.  [facility]  [address] [place], [place] [address]      [phone]  [facility] [address]  [place], [place] [address] [phone]  [facility] [address]  [place], [place] [address]       [phone]      Jovita Kussmaul [facility] [address] [place], [place] [phone]  [facility]:  [address] [place] [phone]  [facility]:  [address] [place] [place], [place] [phone]  [facility] (under & uninsured) [address]   [place] [phone]    [email or web address]    [facility] -[address]     Phone:  [phone]     [facility]-  [address]  [phone]   [facility] [address]      [place], [place] [phone]  [facility]: [address] [place], [place], [place]  [phone]   [facility] (Spanish therapist) [address] [place]  [address]   [place] [address]    [phone]    [facility]   [address],  [place], [place]  [phone]    [facility]  [address] [place]  [phone]  [facility]  [address] [place], [place]        [phone]  [facility] under & uninsured  [place], To schedule an appointment call [phone] [address] [place] [phone]):  Macario Golds from 8 AM - 3 PM Moving June 1 to [facility] [address]  [facility] [address],  (Spanish)   [address], [place]: [phone]) 8:30 - 12; 1 - 2:30  [facility] [employer],  [address] [place], [employer] [place]    [phone]):8:30 - 12; 2 - 3PM  [facility],  [address]  [place]; [phone]):   Mon - Fri 8 AM - 5 PM  [facility] [address] [place]  MWF 12:30 to 3:00 or call to schedule an appointment  [phone]  Specific Provider options Psychology Today  [email or web address]/us 1. click on find a therapist  2. enter your zip code 3. left side and select or tailor a therapist for your specific need.   [facility] Provider Directory [email or web address]  (Medicaid)   Follow all drop down to find a provider  Social Support program [facility] [place] 336)  [phone] or [employer] [address], [place], [place] Recovery support and educational   24- Hour Availability:  .  [facility]  . [address] [place], [place] [employer] [phone] Crisis [phone]  . [facility] [phone]  [facility] Crisis Service  [phone]   . [facility] [employer]  [phone] (after hours)  . [facility]/Mobile Crisis   [phone]  . [place] National Suicide Hotline  [phone] (TALK)  . Call 911 or go to emergency room  . [employer]  [phone]);  [place] and [employer]   . [facility]   [phone]); [place], [place], [place], [place], [place], [place], [place]

## 2019-10-30 ENCOUNTER — Encounter: Payer: Self-pay | Admitting: Family Medicine

## 2019-10-30 LAB — CBC WITH DIFFERENTIAL/PLATELET
Basophils Absolute: 0 10*3/uL (ref 0.0–0.2)
Basos: 0 %
EOS (ABSOLUTE): 0.2 10*3/uL (ref 0.0–0.4)
Eos: 3 %
Hematocrit: 38.7 % (ref 34.0–46.6)
Hemoglobin: 13 g/dL (ref 11.1–15.9)
Immature Grans (Abs): 0 10*3/uL (ref 0.0–0.1)
Immature Granulocytes: 0 %
Lymphocytes Absolute: 1.6 10*3/uL (ref 0.7–3.1)
Lymphs: 25 %
MCH: 36.4 pg — ABNORMAL HIGH (ref 26.6–33.0)
MCHC: 33.6 g/dL (ref 31.5–35.7)
MCV: 108 fL — ABNORMAL HIGH (ref 79–97)
Monocytes Absolute: 0.5 10*3/uL (ref 0.1–0.9)
Monocytes: 9 %
Neutrophils Absolute: 4 10*3/uL (ref 1.4–7.0)
Neutrophils: 63 %
Platelets: 256 10*3/uL (ref 150–450)
RBC: 3.57 x10E6/uL — ABNORMAL LOW (ref 3.77–5.28)
RDW: 12.6 % (ref 11.7–15.4)
WBC: 6.3 10*3/uL (ref 3.4–10.8)

## 2019-10-30 LAB — COMPREHENSIVE METABOLIC PANEL
ALT: 35 IU/L — ABNORMAL HIGH (ref 0–32)
AST: 34 IU/L (ref 0–40)
Albumin/Globulin Ratio: 1.4 (ref 1.2–2.2)
Albumin: 4.1 g/dL (ref 3.8–4.8)
Alkaline Phosphatase: 78 IU/L (ref 48–121)
BUN/Creatinine Ratio: 19 (ref 9–23)
BUN: 13 mg/dL (ref 6–20)
Bilirubin Total: 0.6 mg/dL (ref 0.0–1.2)
CO2: 25 mmol/L (ref 20–29)
Calcium: 10 mg/dL (ref 8.7–10.2)
Chloride: 99 mmol/L (ref 96–106)
Creatinine, Ser: 0.69 mg/dL (ref 0.57–1.00)
GFR calc Af Amer: 128 mL/min/{1.73_m2} (ref >59)
GFR calc non Af Amer: 111 mL/min/{1.73_m2} (ref >59)
Globulin, Total: 2.9 g/dL (ref 1.5–4.5)
Glucose: 80 mg/dL (ref 65–99)
Potassium: 4.3 mmol/L (ref 3.5–5.2)
Sodium: 139 mmol/L (ref 134–144)
Total Protein: 7 g/dL (ref 6.0–8.5)

## 2019-10-30 LAB — TSH: TSH: 1.61 u[IU]/mL (ref 0.450–4.500)

## 2019-10-30 LAB — PT AND PTT
INR: 0.9 (ref 0.9–1.2)
Prothrombin Time: 10.1 s (ref 9.1–12.0)
aPTT: 24 s (ref 24–33)

## 2019-10-31 ENCOUNTER — Encounter: Payer: Self-pay | Admitting: Family Medicine

## 2019-10-31 DIAGNOSIS — S8010XA Contusion of unspecified lower leg, initial encounter: Secondary | ICD-10-CM | POA: Insufficient documentation

## 2019-10-31 DIAGNOSIS — K0889 Other specified disorders of teeth and supporting structures: Secondary | ICD-10-CM | POA: Insufficient documentation

## 2019-10-31 NOTE — Assessment & Plan Note (Addendum)
Samll amount of bruising on left lateral thigh.  Likely due to trauma from work.  Will rule out less likely coagulopathy and liver pathology given patient history of alcohol use.  No likely medication source.  - Obtain CMP, TSH, CBC, and PT-INR - Will inform patient of any abnormal labs - Continue to monitor legs for any possible trauma

## 2019-10-31 NOTE — Progress Notes (Signed)
SUBJECTIVE:   CHIEF COMPLAINT / HPI: Leg Bruising/Tooth Pain  Patient indicates she has had mutiple bruises on legs she has noticed when she wakes up. These bruises are intermittent and come and go in different places along legs. Indicates this has been going on for last few months.  Denies any swelling or itchiness.  Denies any numbness or tingling, leg weakness or difficulty walking.  She has not previous problems with easy bruising or bleeding including nosebleeds.  She currently has an IUD but previously had normal periods without mennorhagia.  Previously indicates she drank a lot of liquor frequently but in past several years only has 1-2 glasses of wine on the weekends.  Denies any recent falls or any abuse.  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  She also compains of tooth pain.  Indicates she saw a dentist recently for toothache and indicated she would have to have several teeth extracted with anesthesia and told her it may be a week or 2 before this could happen.  Did not prescribe her anything for pain in meantime.  Indicates the pain is causing her high amount of distress.  It is currently interfering with her ability to eat and currently is waking her up at night.  Endorses smoking marijuana once a month or so but no other drug  Patient scored 16 on PHQ-9.  Indicates she is feeling depressed and has issues with staying asleep and poor appetite.  Indicates these issues are all recent and issues with eating and sleep are due to tooth pain.  Indicates she feels down and also anxious in regards to her surgery coming up.  No thoughts of wanting to harm herself and no SI or HI.  PERTINENT  PMH / PSH: Dental Carries  OBJECTIVE:   BP (!) 138/102   Pulse 92   Ht 5\' 2"  (1.575 m)   Wt 122 lb (55.3 kg)   SpO2 97%   BMI 22.31 kg/m    Physical Exam Constitutional:      General: She is not in acute distress.    Appearance: Normal appearance.  HENT:     Head:  Normocephalic and atraumatic.     Mouth/Throat:     Dentition: Abnormal dentition.     Pharynx: No oropharyngeal exudate or posterior oropharyngeal erythema.     Comments: Multiple broken teeth Cardiovascular:     Rate and Rhythm: Normal rate and regular rhythm.  Pulmonary:     Effort: Pulmonary effort is normal.     Breath sounds: Normal breath sounds.  Musculoskeletal:        General: Tenderness present. No swelling.  Skin:    Findings: Bruising present. No erythema or rash.     Comments: 1 cm diameter bruise on right lateral thigh  Neurological:     Mental Status: She is alert.     ASSESSMENT/PLAN:   Toothache Patient following dentist for management and tooth extraction. - Continue to follow with dentist - Prescribing Naproxen 500 mg BID for 2 weeks, if haven't seen dentist by then can call in for refill - Can continue taking Tylenol pending normal liver labs  Superficial bruising of lower leg Samll amount of bruising on left lateral thigh.  Likely due to trauma from work.  Will rule out less likely coagulopathy and liver pathology given patient history of alcohol use.  No likely medication source.  - Obtain CMP, TSH, CBC, and PT-INR - Will inform patient of any abnormal labs -  Continue to monitor legs for any possible trauma     Jovita Kussmaul, MD [facility]

## 2019-10-31 NOTE — Assessment & Plan Note (Addendum)
Patient following dentist for management and tooth extraction. - Continue to follow with dentist - Prescribing Naproxen 500 mg BID for 2 weeks, if haven't seen dentist by then can call in for refill - Can continue taking Tylenol pending normal liver labs - Provided patient with counseling and crisis resources, as she deals with apin

## 2019-11-04 ENCOUNTER — Ambulatory Visit: Payer: Self-pay | Admitting: Family Medicine

## 2020-01-09 ENCOUNTER — Encounter: Payer: Self-pay | Admitting: Family Medicine

## 2020-01-09 ENCOUNTER — Telehealth: Payer: Self-pay | Admitting: Psychology

## 2020-01-09 ENCOUNTER — Other Ambulatory Visit: Payer: Self-pay

## 2020-01-09 ENCOUNTER — Ambulatory Visit (INDEPENDENT_AMBULATORY_CARE_PROVIDER_SITE_OTHER): Payer: No Typology Code available for payment source | Admitting: Family Medicine

## 2020-01-09 VITALS — BP 120/90 | HR 76 | Ht 62.0 in | Wt 124.1 lb

## 2020-01-09 DIAGNOSIS — I10 Essential (primary) hypertension: Secondary | ICD-10-CM | POA: Diagnosis not present

## 2020-01-09 DIAGNOSIS — F339 Major depressive disorder, recurrent, unspecified: Secondary | ICD-10-CM

## 2020-01-09 DIAGNOSIS — B009 Herpesviral infection, unspecified: Secondary | ICD-10-CM

## 2020-01-09 DIAGNOSIS — Z789 Other specified health status: Secondary | ICD-10-CM

## 2020-01-09 DIAGNOSIS — F329 Major depressive disorder, single episode, unspecified: Secondary | ICD-10-CM | POA: Insufficient documentation

## 2020-01-09 DIAGNOSIS — Z7289 Other problems related to lifestyle: Secondary | ICD-10-CM

## 2020-01-09 DIAGNOSIS — F17200 Nicotine dependence, unspecified, uncomplicated: Secondary | ICD-10-CM | POA: Diagnosis not present

## 2020-01-09 MED ORDER — TRAZODONE HCL 50 MG PO TABS: 50.0000 mg | ORAL_TABLET | Freq: Every evening | ORAL | 1 refills | Status: DC | PRN

## 2020-01-09 MED ORDER — ESCITALOPRAM OXALATE 10 MG PO TABS: 10.0000 mg | ORAL_TABLET | Freq: Every day | ORAL | 1 refills | Status: DC

## 2020-01-09 MED ORDER — VALACYCLOVIR HCL 1 G PO TABS: 500.0000 mg | ORAL_TABLET | Freq: Every day | ORAL | 3 refills | Status: DC

## 2020-01-09 NOTE — Assessment & Plan Note (Signed)
Off meds and BP looks good. F/U in 4 weeks for reassessment. If BP remains normal, I will d/c her meds altogether. Continue home monitoring.

## 2020-01-09 NOTE — Assessment & Plan Note (Signed)
She continues to cut back slowly.

## 2020-01-09 NOTE — Patient Instructions (Signed)

## 2020-01-09 NOTE — Progress Notes (Signed)
    SUBJECTIVE:   CHIEF COMPLAINT / HPI:   Tobacco/ETOH use: She now smokes 5-6 sticks daily daily. ETOH cut from daily to once weekly, no liquor, drinks wine and mostly on the weekends.  Depression:Off meds for > 5 years, Zoloft - did't help. Counseling helped but d/ced due to, financial issues. She will like to get back on treatment.  HTN: Last took Labetalol 3 months ago. BP was dropping low.  HM: Wants to discuss COVID-19 and flu shot. She asked about vaccination exemption. She already got her two covid shots, but requires a booster shot by her employer as well as flu shot.  PERTINENT  PMH / PSH: PMX reviewed  OBJECTIVE:   BP 120/90   Pulse 76   Ht 5\' 2"  (1.575 m)   Wt 124 lb 2 oz (56.3 kg)   LMP 12/10/2018   SpO2 98%   BMI 22.70 kg/m   Physical Exam Vitals and nursing note reviewed.  Cardiovascular:     Rate and Rhythm: Normal rate and regular rhythm.     Heart sounds: Normal heart sounds. No murmur heard.   Pulmonary:     Effort: Pulmonary effort is normal. No respiratory distress.     Breath sounds: Normal breath sounds. No wheezing or rhonchi.  Abdominal:     General: Abdomen is flat. Bowel sounds are normal. There is no distension.     Palpations: There is no mass.     Tenderness: There is no abdominal tenderness.  Musculoskeletal:     Right lower leg: No edema.     Left lower leg: No edema.  Psychiatric:        Mood and Affect: Mood normal.        Behavior: Behavior normal.        Thought Content: Thought content does not include homicidal or suicidal ideation.      Office Visit from 01/09/2020 in [facility]  PHQ-9 Total Score 17        ASSESSMENT/PLAN:   HTN (hypertension) Off meds and BP looks good. F/U in 4 weeks for reassessment. If BP remains normal, I will d/c her meds altogether. Continue home monitoring.  TOBACCO DEPENDENCE She continues to cut back slowly.  Alcohol use Denies intoxication. Doing well with  moderate intake.  MDD (major depressive disorder) Record reviewed as far back as 2012. She had been on Celexa and Trazodone in the past. I discussed trial of Lexapro and Trazodone qhs prn insomnia. She will also like to get connected with our [facility] specialist for a short term intervention. Message sent to [facility].   Vaccination discussion: I discussed vaccine exemptions with her. No documented hx of anaphylaxis reaction to influenza or COVID-19 shot. I will give her information on who need to get COVID-19 vaccination.  I think since she is a Shawnee Knapp, she will fall into that category.  Scientist, forensic, MD [facility]

## 2020-01-09 NOTE — Assessment & Plan Note (Signed)
Record reviewed as far back as 2012. She had been on Celexa and Trazodone in the past. I discussed trial of Lexapro and Trazodone qhs prn insomnia. She will also like to get connected with our [facility] specialist for a short term intervention. Message sent to Dr. Shawnee Knapp.

## 2020-01-09 NOTE — Telephone Encounter (Signed)
Called and left VM for scheduling BH appt

## 2020-01-09 NOTE — Assessment & Plan Note (Signed)
Denies intoxication. Doing well with moderate intake.

## 2020-02-06 ENCOUNTER — Ambulatory Visit (INDEPENDENT_AMBULATORY_CARE_PROVIDER_SITE_OTHER): Payer: No Typology Code available for payment source | Admitting: Family Medicine

## 2020-02-06 ENCOUNTER — Other Ambulatory Visit: Payer: Self-pay

## 2020-02-06 ENCOUNTER — Encounter: Payer: Self-pay | Admitting: Family Medicine

## 2020-02-06 VITALS — BP 120/72 | HR 84 | Ht 62.0 in | Wt 123.0 lb

## 2020-02-06 DIAGNOSIS — I10 Essential (primary) hypertension: Secondary | ICD-10-CM

## 2020-02-06 DIAGNOSIS — H9201 Otalgia, right ear: Secondary | ICD-10-CM | POA: Diagnosis not present

## 2020-02-06 DIAGNOSIS — F339 Major depressive disorder, recurrent, unspecified: Secondary | ICD-10-CM | POA: Diagnosis not present

## 2020-02-06 DIAGNOSIS — K0889 Other specified disorders of teeth and supporting structures: Secondary | ICD-10-CM

## 2020-02-06 MED ORDER — CIPROFLOXACIN-DEXAMETHASONE 0.3-0.1 % OT SUSP: 4.0000 [drp] | Freq: Two times a day (BID) | OTIC | 0 refills | Status: AC

## 2020-02-06 NOTE — Progress Notes (Signed)
    SUBJECTIVE:   CHIEF COMPLAINT / HPI:  Depression: Feels Lexapro and Trazodone is working just fine. She wants to ensure she has refills. She missed connection with Dr. Shawnee Knapp the last time and really wants to connect with her. I advised her to all our front office to help  HTN: Off meds for months. Here for f/u.   Right earache: Started a few days ago. Denies ear discharge or hearing loss.   Dentist:C/O dental pain. She has been unable to connect with a dentist due to her insurance. Uses Tylenol as needed.  PERTINENT  PMH / PSH: PMX reviewed.  OBJECTIVE:   BP 120/72   Pulse 84   Ht 5\' 2"  (1.575 m)   Wt 123 lb (55.8 kg)   SpO2 99%   BMI 22.50 kg/m   Physical Exam Vitals and nursing note reviewed.  HENT:     Right Ear: Tympanic membrane normal.     Left Ear: Tympanic membrane normal.     Ears:     Comments: Scanty ear wax B/L. Tender pinna and EAM. Cardiovascular:     Rate and Rhythm: Normal rate and regular rhythm.     Heart sounds: Normal heart sounds. No murmur heard.   Pulmonary:     Effort: Pulmonary effort is normal. No respiratory distress.     Breath sounds: Normal breath sounds. No wheezing or rhonchi.  Abdominal:     General: Abdomen is flat. Bowel sounds are normal. There is no distension.     Palpations: Abdomen is soft. There is no mass.     Tenderness: There is no abdominal tenderness.  Neurological:     General: No focal deficit present.     Mental Status: She is oriented to person, place, and time.      Office Visit from 02/06/2020 in [facility]  PHQ-9 Total Score 5        ASSESSMENT/PLAN:   MDD (major depressive disorder) Improved mood on current regimen. Continue same. Trazodone refilled for insomnia. She should be good on her Lexapro for now. She will contact front office staff to get connected with Dr. Borgarnes. I will also forward this note to Dr. Shawnee Knapp for review.  HTN (hypertension) Stable off med. D/C  Labetalol altogether.   Otalgia: Symptoms and sign suggestive of otitis external. Will treat with otic A/B drops. F/U as needed.  Dental pain: I advised her to reach out to her insurance for dental referral. She agreed with the plan.  Shawnee Knapp, MD [facility]

## 2020-02-06 NOTE — Assessment & Plan Note (Signed)
Improved mood on current regimen. Continue same. Trazodone refilled for insomnia. She should be good on her Lexapro for now. She will contact front office staff to get connected with Dr. Shawnee Knapp. I will also forward this note to Dr. Shawnee Knapp for review.

## 2020-02-06 NOTE — Assessment & Plan Note (Signed)
Stable off med. D/C Labetalol altogether.

## 2020-02-06 NOTE — Patient Instructions (Signed)
Blood Pressure Record Sheet To take your blood pressure, you will need a blood pressure machine. You can buy a blood pressure machine (blood pressure monitor) at your clinic, drug store, or online. When choosing one, consider:  An automatic monitor that has an arm cuff.  A cuff that wraps snugly around your upper arm. You should be able to fit only one finger between your arm and the cuff.  A device that stores blood pressure reading results.  Do not choose a monitor that measures your blood pressure from your wrist or finger. Follow your health care provider's instructions for how to take your blood pressure. To use this form:  Get one reading in the morning (a.m.) before you take any medicines.  Get one reading in the evening (p.m.) before supper.  Take at least 2 readings with each blood pressure check. This makes sure the results are correct. Wait 1-2 minutes between measurements.  Write down the results in the spaces on this form.  Repeat this once a week, or as told by your health care provider.  Make a follow-up appointment with your health care provider to discuss the results. Blood pressure log Date: _______________________  a.m. _____________________(1st reading) _____________________(2nd reading)  p.m. _____________________(1st reading) _____________________(2nd reading) Date: _______________________  a.m. _____________________(1st reading) _____________________(2nd reading)  p.m. _____________________(1st reading) _____________________(2nd reading) Date: _______________________  a.m. _____________________(1st reading) _____________________(2nd reading)  p.m. _____________________(1st reading) _____________________(2nd reading) Date: _______________________  a.m. _____________________(1st reading) _____________________(2nd reading)  p.m. _____________________(1st reading) _____________________(2nd reading) Date: _______________________  a.m.  _____________________(1st reading) _____________________(2nd reading)  p.m. _____________________(1st reading) _____________________(2nd reading) This information is not intended to replace advice given to you by your health care provider. Make sure you discuss any questions you have with your health care provider. Document Revised: 05/25/2017 Document Reviewed: 03/27/2017 Elsevier Patient Education  2020 Elsevier Inc.  

## 2020-02-25 ENCOUNTER — Other Ambulatory Visit: Payer: Self-pay | Admitting: Family Medicine

## 2020-03-01 NOTE — Telephone Encounter (Signed)
Patient calls nurse line stating that she just received a phone call from our office. Patient reports that the caller did not leave a message, however, patient has recent denied medication refill request. Patient states that she has been using this medication for her dental pain, as she has been unable to establish care with a dental office. Patient reports that she is only taking medication when absolutely necessary for pain management.   To PCP  Veronda Prude, RN

## 2020-03-01 NOTE — Telephone Encounter (Signed)
I did not call her.  However, I attempted to reach her daughter, I did not leave a message, because I was uncertain of her correct phone number. If possible, please contact her to update her daughter's phone number on file.  Also, if her Naproxen was denied, she can use Ibuprofen OTC instead. Follow-up soon with me.

## 2020-03-08 ENCOUNTER — Encounter: Payer: Self-pay | Admitting: Family Medicine

## 2020-03-08 ENCOUNTER — Other Ambulatory Visit: Payer: Self-pay | Admitting: Family Medicine

## 2020-03-08 MED ORDER — NAPROXEN 500 MG PO TBEC: DELAYED_RELEASE_TABLET | ORAL | 0 refills | Status: DC

## 2020-03-08 NOTE — Telephone Encounter (Signed)
See My Chart Message.

## 2020-03-08 NOTE — Telephone Encounter (Signed)
Patient calls nurse line upset that no one has reached out to hear regarding Naproxen refill. Patient reports she has 5 teeth that need to be pulled, however she is unable to establish with a dentist. Patient offered an apt here, however she stated, "for the 15th time I can not not come in there due to work." Patient has been taking OTC ibuprofen with no relief. Please call patient.    [phone]

## 2020-05-12 ENCOUNTER — Ambulatory Visit: Payer: No Typology Code available for payment source

## 2020-05-12 NOTE — Progress Notes (Deleted)
    SUBJECTIVE:   CHIEF COMPLAINT / HPI:   Uti:   PERTINENT  PMH / PSH: ***  OBJECTIVE:   There were no vitals taken for this visit.  ***  ASSESSMENT/PLAN:   No problem-specific Assessment & Plan notes found for this encounter.     Sandre Kitty, MD [facility]

## 2020-05-13 ENCOUNTER — Other Ambulatory Visit: Payer: Self-pay | Admitting: Physician Assistant

## 2020-05-13 DIAGNOSIS — R10813 Right lower quadrant abdominal tenderness: Secondary | ICD-10-CM

## 2020-05-14 ENCOUNTER — Ambulatory Visit
Admission: RE | Admit: 2020-05-14 | Discharge: 2020-05-14 | Disposition: A | Discharge status: Home / Self Care | Payer: PRIVATE HEALTH INSURANCE | Source: Ambulatory Visit | Attending: Physician Assistant | Admitting: Physician Assistant

## 2020-05-14 DIAGNOSIS — R10813 Right lower quadrant abdominal tenderness: Secondary | ICD-10-CM

## 2020-05-14 IMAGING — CT CT ABD-PELV W/ CM
2 of 4 series · 11 of 46 positions shown, 12 images · IV contrast (iopamidol)
Comparison: None.

CLINICAL DATA: Right lower quadrant abdominal tenderness, nausea,
vomiting, diarrhea for 1 week

EXAM:
CT ABDOMEN AND PELVIS WITH CONTRAST
TECHNIQUE: Multidetector CT imaging of the abdomen and pelvis was performed
using the standard protocol following bolus administration of
intravenous contrast.
CONTRAST:  100mL [4T] IOPAMIDOL ([4T]) INJECTION 61%

[Series 2: abd pelvis 5.00 br40 s3 axial · axial · 0.46mm/px · z∈[+1334,+1669]mm · 8 of 81 slices shown, 9 images]
[im 7/81  soft-tissue]
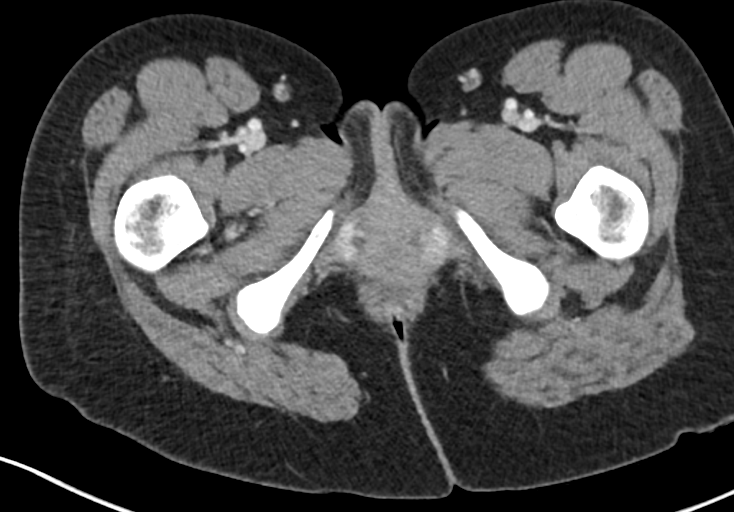
[im 7/81  bone]
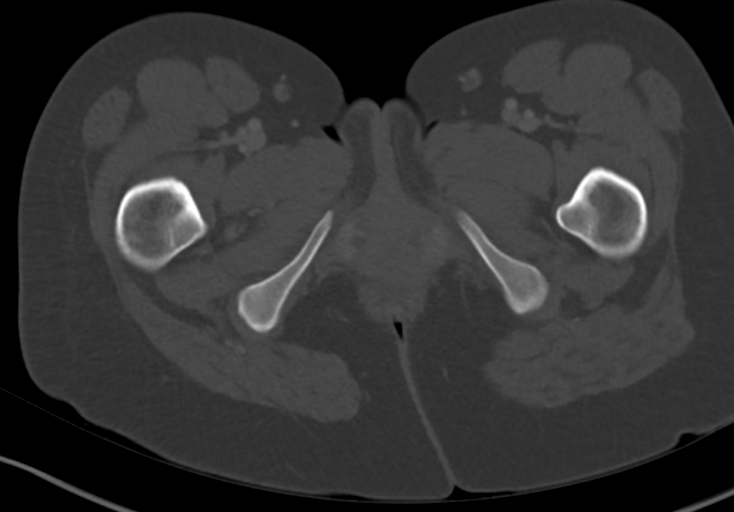
[im 17/81  soft-tissue]
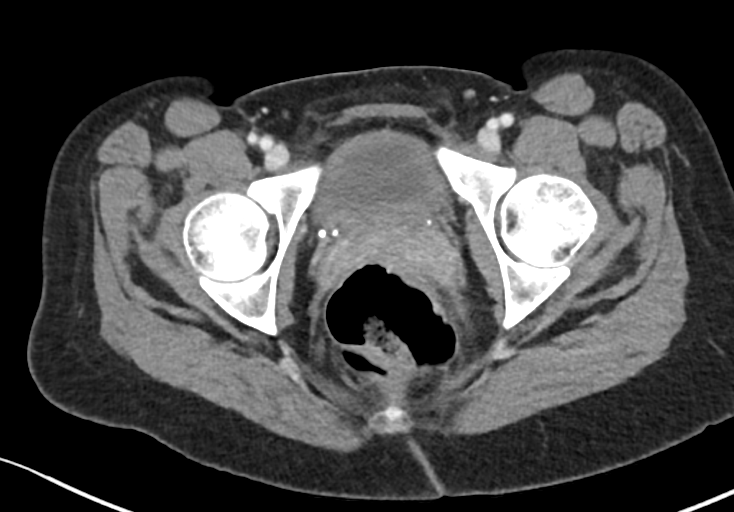
[im 27/81  soft-tissue]
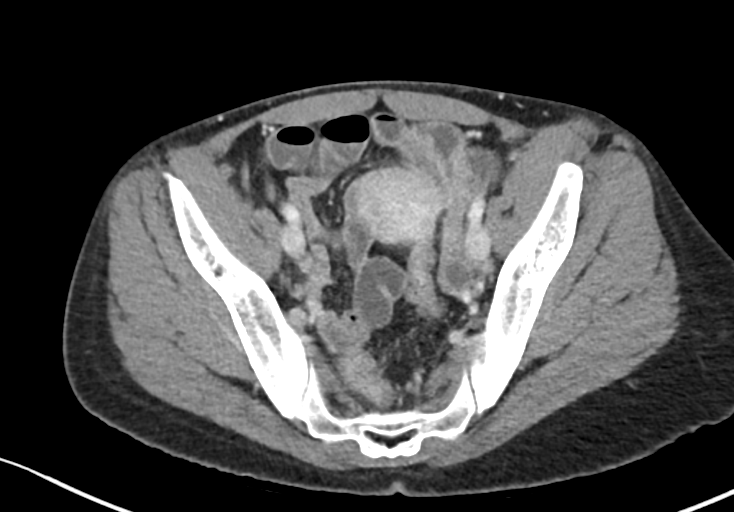
[im 37/81  soft-tissue]
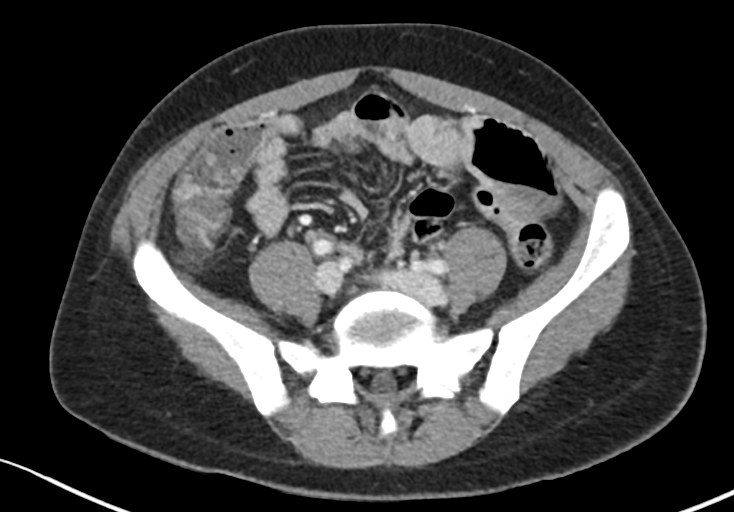
[im 44/81  soft-tissue]
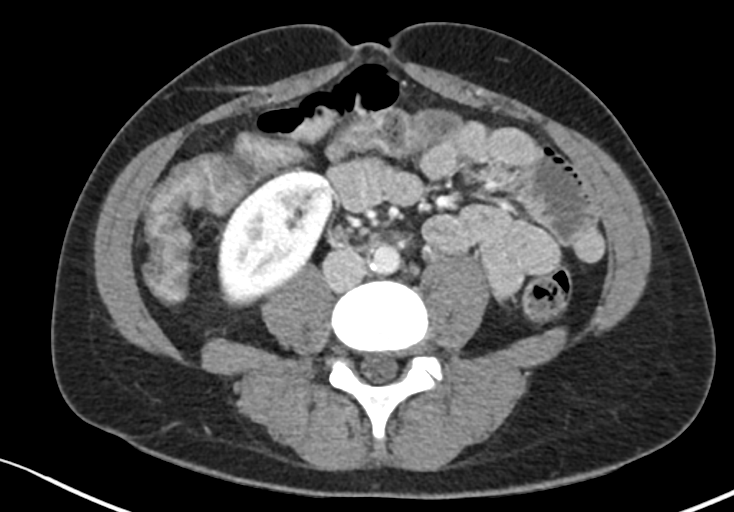
[im 54/81  soft-tissue]
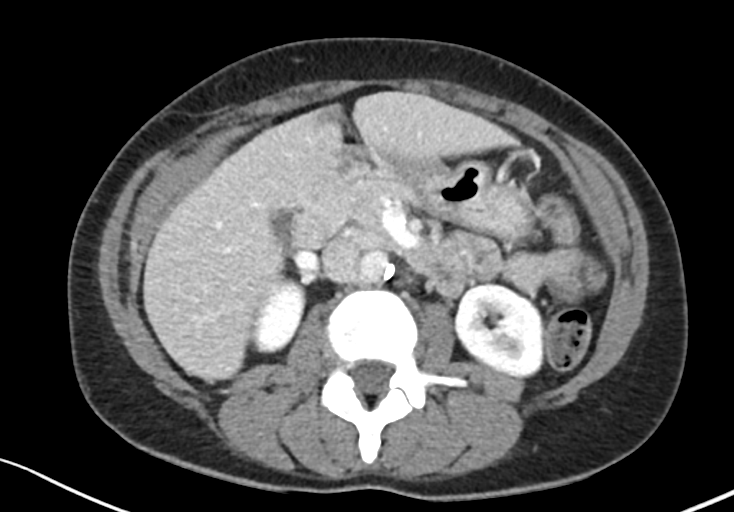
[im 64/81  soft-tissue]
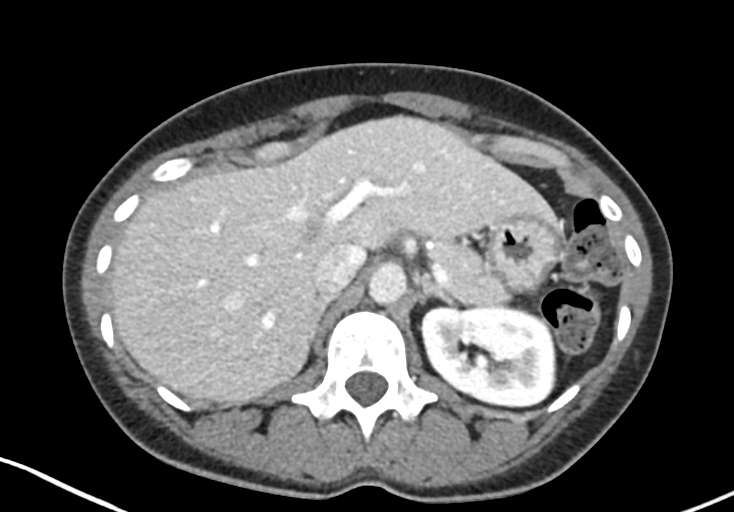
[im 74/81  soft-tissue]
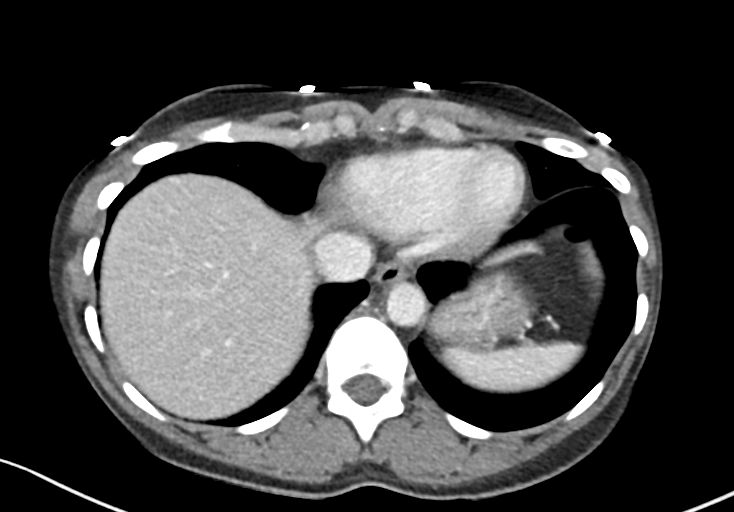

[Series 6: abd pelvis 2.00 br40 s3 cor · coronal · 0.65mm/px · 3 of 116 slices shown]
[im 39/116  soft-tissue]
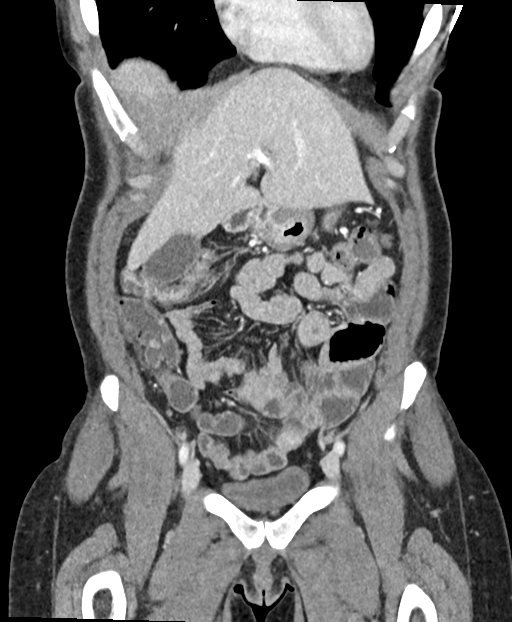
[im 52/116  soft-tissue]
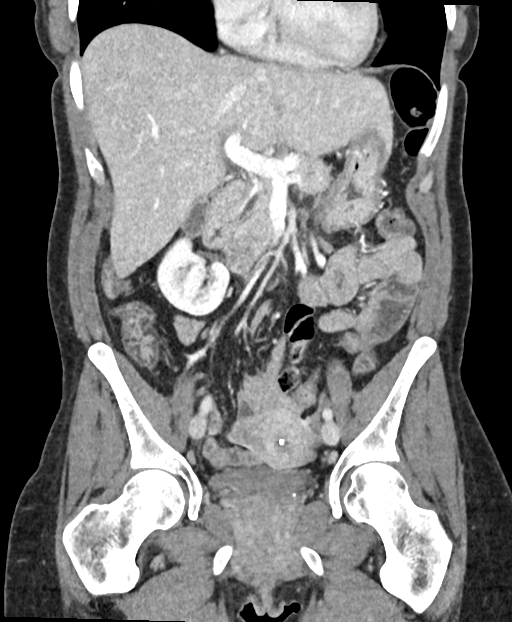
[im 64/116  soft-tissue]
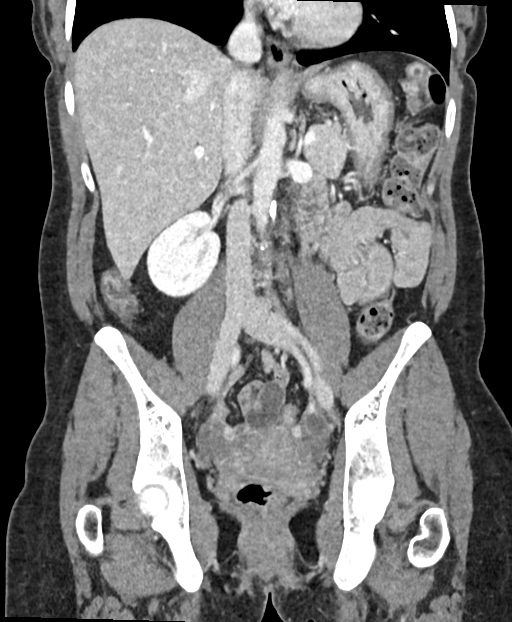

[11 of 46 positions shown; findings below may reference images not displayed]

FINDINGS: Lower chest: No acute abnormality.

Hepatobiliary: No solid liver abnormality is seen. No gallstones,
gallbladder wall thickening, or biliary dilatation.

Pancreas: Unremarkable. No pancreatic ductal dilatation or
surrounding inflammatory changes.

Spleen: Normal in size without significant abnormality.

Adrenals/Urinary Tract: Adrenal glands are unremarkable. Kidneys are
normal, without renal calculi, solid lesion, or hydronephrosis.
Bladder is unremarkable.

Stomach/Bowel: Stomach is within normal limits. Appendix appears
normal. There is mild wall thickening and fat stranding about the
cecum and ascending colon with some fatty mural stratification of
the cecal base (series 6, image 56, series 2, image 38).

Vascular/Lymphatic: Aortic atherosclerosis. No enlarged abdominal or
pelvic lymph nodes.

Reproductive: IUD present in the fundal endometrial cavity.
Functional bilateral ovarian follicles. No mass or other significant
abnormality.

Other: No abdominal wall hernia or abnormality. No abdominopelvic
ascites.

Musculoskeletal: No acute or significant osseous findings.
IMPRESSION: 1. There is mild wall thickening and fat stranding about the cecum
and ascending colon with some fatty mural stratification of the
cecal base. Findings are consistent with nonspecific infectious or
inflammatory colitis, and appearance of the cecum suggests chronic
or recurrent inflammation. Inflammatory bowel disease, particularly
Crohn's disease, could have this appearance.
2. Normal appendix.
3. Aortic atherosclerosis, advanced for patient age.

Aortic Atherosclerosis ([4T]-[4T]).

## 2020-05-14 MED ORDER — IOPAMIDOL (ISOVUE-300) INJECTION 61%
100.0000 mL | Freq: Once | INTRAVENOUS | Status: AC | PRN
  Administered 2020-05-14: 100 mL via INTRAVENOUS

## 2020-07-05 ENCOUNTER — Other Ambulatory Visit: Payer: Self-pay | Admitting: Family Medicine

## 2020-08-12 ENCOUNTER — Encounter: Payer: Self-pay | Admitting: Family Medicine

## 2020-08-12 NOTE — Telephone Encounter (Signed)
Called patient to discuss mychart message. No answer. Left HIPAA compliant VM for patient to return call to office to discuss further.   Veronda Prude, RN

## 2020-08-13 ENCOUNTER — Other Ambulatory Visit: Payer: Self-pay

## 2020-08-13 ENCOUNTER — Ambulatory Visit (INDEPENDENT_AMBULATORY_CARE_PROVIDER_SITE_OTHER): Payer: No Typology Code available for payment source | Admitting: Family Medicine

## 2020-08-13 ENCOUNTER — Ambulatory Visit: Payer: No Typology Code available for payment source

## 2020-08-13 ENCOUNTER — Ambulatory Visit (HOSPITAL_COMMUNITY)
Admission: RE | Admit: 2020-08-13 | Discharge: 2020-08-13 | Disposition: A | Discharge status: Home / Self Care | Payer: No Typology Code available for payment source | Source: Ambulatory Visit | Attending: Family Medicine | Admitting: Family Medicine

## 2020-08-13 VITALS — BP 155/110 | HR 87 | Ht 62.0 in | Wt 131.0 lb

## 2020-08-13 DIAGNOSIS — H538 Other visual disturbances: Secondary | ICD-10-CM | POA: Diagnosis not present

## 2020-08-13 DIAGNOSIS — I10 Essential (primary) hypertension: Secondary | ICD-10-CM | POA: Insufficient documentation

## 2020-08-13 DIAGNOSIS — R079 Chest pain, unspecified: Secondary | ICD-10-CM | POA: Insufficient documentation

## 2020-08-13 MED ORDER — TELMISARTAN 20 MG PO TABS: 20.0000 mg | ORAL_TABLET | Freq: Every day | ORAL | 3 refills | Status: DC

## 2020-08-13 NOTE — Assessment & Plan Note (Addendum)
Assessment: 40 year old female with 2 days of blurry vision and some irritation/pain in the right eye.  She believes this is most likely related to allergies or irritation from that.  She has been using eyedrops.  She has a normal neurologic exam today.  She does express some discomfort in the most lateral aspect of the eye which could be consistent with an abrasion.  I have low concern for etiology such as closed angle glaucoma as a cause.  During this time she has also had some elevated blood pressures but has endorsed blurry vision in the past.  I think the most likely differential is an abrasion as the cause of her symptoms and that the blurry vision is secondary to this.  Particular the patient endorses some discomfort in the lateral aspect of the eye and that they have been watering more because of this. -Recommend patient continue using lubricating eyedrops -Recommend patient follow-up with [place] if her eye irritation does not resolve in the next 2 to 3 days

## 2020-08-13 NOTE — Assessment & Plan Note (Signed)
Chest pain for 2 weeks there is located mostly in the left upper aspect of the left chest.  This is been going on since she has had elevated blood pressures.  She denies any diaphoresis or shortness of breath associated with the chest pains.  They occur both rest and with activity and seem to resolve with stretching.  On physical exam she has some mild discomfort to palpation in the same region but states it does not quite feel like the chest pain she has been having.  Low concern for cardiac etiology in this otherwise healthy 40 year old, there may be some relation to her high blood pressure, however most likely but this is musculoskeletal in etiology. -EKG performed today shows normal sinus rhythm -We will continue to monitor and see if this resolves with good blood pressure control -Return precautions discussed

## 2020-08-13 NOTE — Patient Instructions (Signed)
Our plan for today: We are starting a blood pressure medicine called telmisartan.  I sent this to your pharmacy.  We are going to check some blood work today and I would like to see you back in 1 week for blood pressure check and to check blood work again.  For your chest tightness we performed an EKG which did not show any obvious abnormalities.  This is good as we now have a baseline EKG for you.  I want to continue to monitor it while we treat your blood pressure and see if it resolves with this.  If it is musculoskeletal in etiology it should get better in a few more days.  If you develop any crushing chest pain, shortness of breath, or other concerning symptoms please go to the emergency department.  For your right eye pain and blurry vision I want to continue to monitor this and use the eyedrops.  If it is not better in the next 2 to 3 days I would like for you to come back for follow-up visit.  I expect this is likely due to allergies or small abrasion and will likely heal 3 to 4 days from the time that it started.

## 2020-08-13 NOTE — Assessment & Plan Note (Signed)
Assessment: Blood pressure elevated today 155/110.  Patient has a history of hypertension.  We will initiate telmisartan.  We will check a BMP today and will recheck in 1 week when she follows up for BP check

## 2020-08-13 NOTE — Progress Notes (Signed)
SUBJECTIVE:   CHIEF COMPLAINT / HPI:   Hypertension: Patient is a 40 year old female who presents today for high blood pressure.  Patient is noted multiple high blood pressure readings over the past few days in the 150/100, 162/90, and 158/108 range.  She was previously on labetalol for blood pressure but had stopped that sometime ago as her pressures have been well maintained.  Chest tightness: Has been going on for two weeks or so, over the last week seems to happen more often. She feels it mostly in the left part of her chest. It happens with both exercise and rest and gets better when she stretches the area. No shortness of breath or diaphoresis associated with it.   Right eye pain and blurry vision: Patient states for the past 2 days she has had some right eye pain and blurry vision.  The pain seems to be located on the most lateral aspect of the eye and she thinks that it may be allergies versus an abrasion.  She denies any drainage from the eye.  She states she has been using her lubricating eyedrops.  She has had irritation/pain in her eyes in the past from allergies and irritation  PERTINENT  PMH / PSH: History of hypertension  OBJECTIVE:   BP (!) 155/110   Pulse 87   Ht 5\' 2"  (1.575 m)   Wt 131 lb (59.4 kg)   SpO2 99%   BMI 23.96 kg/m    General: NAD, pleasant, able to participate in exam HEENT: No obvious abrasion or deformity to the eye.  The patient does express some discomfort to the most lateral aspect of the right eye.  Patient does have watering of the right eye.  No significant pain with movement except on the most lateral aspect of the right eye when it moves underneath her eyelid, which suggest an abrasion.  No photophobia on physical exam.  She has full extraocular motion.. Cardiac: RRR, no murmurs Respiratory: CTAB, normal effort Neuro: CN II through XII intact, fine touch sensation intact in upper and lower extremities bilaterally, strength 5/5 in upper and  lower extremities bilaterally Psych: Normal affect and mood  ASSESSMENT/PLAN:   HTN (hypertension) Assessment: Blood pressure elevated today 155/110.  Patient has a history of hypertension.  We will initiate telmisartan.  We will check a BMP today and will recheck in 1 week when she follows up for BP check  Blurred vision, right eye Assessment: 40 year old female with 2 days of blurry vision and some irritation/pain in the right eye.  She believes this is most likely related to allergies or irritation from that.  She has been using eyedrops.  She has a normal neurologic exam today.  She does express some discomfort in the most lateral aspect of the eye which could be consistent with an abrasion.  I have low concern for etiology such as closed angle glaucoma as a cause.  During this time she has also had some elevated blood pressures but has endorsed blurry vision in the past.  I think the most likely differential is an abrasion as the cause of her symptoms and that the blurry vision is secondary to this.  Particular the patient endorses some discomfort in the lateral aspect of the eye and that they have been watering more because of this. -Recommend patient continue using lubricating eyedrops -Recommend patient follow-up with 24 if her eye irritation does not resolve in the next 2 to 3 days   Chest pain Chest pain  for 2 weeks there is located mostly in the left upper aspect of the left chest.  This is been going on since she has had elevated blood pressures.  She denies any diaphoresis or shortness of breath associated with the chest pains.  They occur both rest and with activity and seem to resolve with stretching.  On physical exam she has some mild discomfort to palpation in the same region but states it does not quite feel like the chest pain she has been having.  Low concern for cardiac etiology in this otherwise healthy 40 year old, there may be some relation to her high blood pressure, however  most likely but this is musculoskeletal in etiology. -EKG performed today shows normal sinus rhythm -We will continue to monitor and see if this resolves with good blood pressure control -Return precautions discussed     Jackelyn Poling, DO [facility]    This note was prepared using Dragon voice recognition software and may include unintentional dictation errors due to the inherent limitations of voice recognition software.

## 2020-08-14 LAB — BASIC METABOLIC PANEL
BUN/Creatinine Ratio: 20 (ref 9–23)
BUN: 14 mg/dL (ref 6–20)
CO2: 25 mmol/L (ref 20–29)
Calcium: 9.5 mg/dL (ref 8.7–10.2)
Chloride: 101 mmol/L (ref 96–106)
Creatinine, Ser: 0.7 mg/dL (ref 0.57–1.00)
Glucose: 95 mg/dL (ref 65–99)
Potassium: 4.4 mmol/L (ref 3.5–5.2)
Sodium: 140 mmol/L (ref 134–144)
eGFR: 113 mL/min/{1.73_m2} (ref >59)

## 2020-09-08 ENCOUNTER — Telehealth (INDEPENDENT_AMBULATORY_CARE_PROVIDER_SITE_OTHER): Payer: No Typology Code available for payment source | Admitting: Family Medicine

## 2020-09-08 ENCOUNTER — Encounter: Payer: Self-pay | Admitting: Family Medicine

## 2020-09-08 DIAGNOSIS — K0889 Other specified disorders of teeth and supporting structures: Secondary | ICD-10-CM | POA: Insufficient documentation

## 2020-09-08 MED ORDER — AMOXICILLIN-POT CLAVULANATE 875-125 MG PO TABS: 1.0000 | ORAL_TABLET | Freq: Two times a day (BID) | ORAL | 0 refills | Status: AC

## 2020-09-08 MED ORDER — OXYCODONE HCL 5 MG PO TABS: 2.5000 mg | ORAL_TABLET | ORAL | 0 refills | Status: AC | PRN

## 2020-09-08 NOTE — Assessment & Plan Note (Signed)
Patient with approximately 3 days of worsening tooth pain and concern for dental abscess.  Unfortunately, exam was very limited due to poor video quality. She was speaking clearly without signs of respiratory distress. Recommended that patient come in for in person appointment especially given her significant pain.  Unfortunately, she believes this will be very difficult for her given that they are very short staffed at work.  At this time, will empirically treat with Augmentin x7 days.  Gave short supply of Oxy IR 2.5 mg to last 3 days.  Advised that I cannot give further narcotic pain medication and that she would need to come in for evaluation. She is understanding of this. Return precautions given to her should symptoms/pain becomes worse or spreads and if she develops fevers.

## 2020-09-08 NOTE — Telephone Encounter (Signed)
Patient calls nurse line to follow up on mychart message. Patient states that she would not be able to come into the office until Thursday afternoon, however, we are completely booked until Friday. Patient states that she would be able to do virtual visit if this would be appropriate.   Patient states that we can LVM on cell phone or respond via mychart due to work schedule.   Please advise.   Veronda Prude, RN

## 2020-09-08 NOTE — Progress Notes (Signed)
[facility] Telemedicine Visit  Patient consented to have virtual visit and was identified by name and date of birth. Method of visit: Video was attempted but was interrupted: >50% of visit completed via video  Encounter participants: Patient: Julia May - located at work Provider: Sabino Dick - located at [facility] clinic  Chief Complaint: "Dental Abscess"  HPI: Patient states that about 2 to 3 days ago she developed a "dental abscess".  Located on left upper side of her mouth.  She has a an appointment with a dentist on 6/13.  Denies any fevers.  Believes that the abscess is getting worse.  She believes she requires antibiotics at this point.  Also with significant pain that is not controlled with Tylenol or naproxen.  Having difficulty eating or sleeping secondary to the pain.  Would like something stronger for pain control since her appointment is not for approximately 2 weeks.  No drainage to the area.  States that she is scheduled to have 5 teeth removed at her appointment.  She has never had similar symptoms in the past.  ROS: per HPI  Pertinent PMHx:  Past Medical History:  Diagnosis Date  . Amenorrhea 11/17/2016  . ANEMIA, IRON DEFICIENCY, UNSPEC. 06/07/2006   Qualifier: Diagnosis of  By: Gavin Potters MD, HEIDI    . Blurred vision, right eye 11/30/2015    Exam:  There were no vitals taken for this visit.  General: awake, alert, in mild distress Oropharynx: limited by poor video quality- poor dentition, missing/chipped tooth on top left. States this is the area of concern Respiratory: Breathing comfortably on room air, in no respiratory distress  Assessment/Plan:  Pain, dental Patient with approximately 3 days of worsening tooth pain and concern for dental abscess.  Unfortunately, exam was very limited due to poor video quality. She was speaking clearly without signs of respiratory distress. Recommended that patient come in for in person appointment  especially given her significant pain.  Unfortunately, she believes this will be very difficult for her given that they are very short staffed at work.  At this time, will empirically treat with Augmentin x7 days.  Gave short supply of Oxy IR 2.5 mg to last 3 days.  Advised that I cannot give further narcotic pain medication and that she would need to come in for evaluation. She is understanding of this. Return precautions given to her should symptoms/pain becomes worse or spreads and if she develops fevers.     Time spent during visit with patient: 15 minutes

## 2020-09-16 ENCOUNTER — Encounter: Payer: Self-pay | Admitting: Family Medicine

## 2020-11-09 ENCOUNTER — Encounter: Payer: Self-pay | Admitting: Family Medicine

## 2020-11-09 ENCOUNTER — Other Ambulatory Visit: Payer: Self-pay | Admitting: Family Medicine

## 2020-11-09 DIAGNOSIS — B009 Herpesviral infection, unspecified: Secondary | ICD-10-CM

## 2020-11-09 MED ORDER — VALACYCLOVIR HCL 500 MG PO TABS: 500.0000 mg | ORAL_TABLET | Freq: Every day | ORAL | 1 refills | Status: DC

## 2021-02-16 ENCOUNTER — Encounter: Payer: Self-pay | Admitting: Family Medicine

## 2021-05-06 ENCOUNTER — Encounter: Payer: Self-pay | Admitting: Family Medicine

## 2021-05-06 ENCOUNTER — Other Ambulatory Visit: Payer: Self-pay

## 2021-05-06 ENCOUNTER — Ambulatory Visit (INDEPENDENT_AMBULATORY_CARE_PROVIDER_SITE_OTHER): Payer: No Typology Code available for payment source | Admitting: Family Medicine

## 2021-05-06 VITALS — BP 137/99 | HR 87 | Ht 62.0 in | Wt 134.0 lb

## 2021-05-06 DIAGNOSIS — B009 Herpesviral infection, unspecified: Secondary | ICD-10-CM | POA: Insufficient documentation

## 2021-05-06 DIAGNOSIS — Z0000 Encounter for general adult medical examination without abnormal findings: Secondary | ICD-10-CM

## 2021-05-06 DIAGNOSIS — I10 Essential (primary) hypertension: Secondary | ICD-10-CM | POA: Diagnosis not present

## 2021-05-06 MED ORDER — CYCLOBENZAPRINE HCL 5 MG PO TABS: 5.0000 mg | ORAL_TABLET | Freq: Two times a day (BID) | ORAL | 0 refills | Status: DC | PRN

## 2021-05-06 MED ORDER — HYDROXYZINE HCL 50 MG PO TABS: 50.0000 mg | ORAL_TABLET | Freq: Three times a day (TID) | ORAL | 1 refills | Status: AC | PRN

## 2021-05-06 MED ORDER — VALACYCLOVIR HCL 500 MG PO TABS: 500.0000 mg | ORAL_TABLET | Freq: Every day | ORAL | 1 refills | Status: AC

## 2021-05-06 MED ORDER — TRAZODONE HCL 50 MG PO TABS
50.0000 mg | ORAL_TABLET | Freq: Every evening | ORAL | 1 refills | Status: AC | PRN
Start: 2021-05-06 — End: ?

## 2021-05-06 MED ORDER — ALBUTEROL SULFATE HFA 108 (90 BASE) MCG/ACT IN AERS: 2.0000 | INHALATION_SPRAY | RESPIRATORY_TRACT | 6 refills | Status: AC | PRN

## 2021-05-06 MED ORDER — AMLODIPINE BESYLATE 5 MG PO TABS: 5.0000 mg | ORAL_TABLET | Freq: Every day | ORAL | 1 refills | Status: DC

## 2021-05-06 MED ORDER — ESCITALOPRAM OXALATE 10 MG PO TABS: ORAL_TABLET | ORAL | 1 refills | Status: DC

## 2021-05-06 NOTE — Progress Notes (Signed)
Subjective:     Julia May is a 41 y.o. female and is here for a comprehensive physical exam. The patient reports problems - swimmy head from ear impaction. She has ENT appointment in March. Drink less, mostly on the weekends and not during the week. She will drink a 5th of a bottle over 2-3 days which is an improvement from how much she drank in the past.   Smoking: 1 pack every three days from one pack a day. She self d/c her Micardis for BP due to intolerance. Her home BP has been high.  Social History   Socioeconomic History   Marital status: Single    Spouse name: Not on file   Number of children: Not on file   Years of education: Not on file   Highest education level: Not on file  Occupational History   Not on file  Tobacco Use   Smoking status: Every Day    Packs/day: 0.50    Years: 7.00    Pack years: 3.50    Types: Cigarettes   Smokeless tobacco: Never  Substance and Sexual Activity   Alcohol use: Yes    Alcohol/week: 1.0 standard drink    Types: 1 Glasses of wine per week    Comment: quit 38 days ago 03/09/11 todays date   Drug use: Yes    Frequency: 7.0 times per week    Types: Marijuana    Comment: marajuana, cocaine   Sexual activity: Not Currently    Partners: Male    Comment: last month  Other Topics Concern   Not on file  Social History Narrative   Not on file   Social Determinants of Health   Financial Resource Strain: Not on file  Food Insecurity: Not on file  Transportation Needs: Not on file  Physical Activity: Not on file  Stress: Not on file  Social Connections: Not on file  Intimate Partner Violence: Not on file   Health Maintenance  Topic Date Due   COVID-19 Vaccine (3 - Booster for Pfizer series) 05/22/2021 (Originally 10/24/2019)   TETANUS/TDAP  08/13/2021 (Originally 03/10/2016)   PAP SMEAR-Modifier  01/06/2022   INFLUENZA VACCINE  Completed   Hepatitis C Screening  Completed   HIV Screening  Completed   HPV VACCINES  Aged  Out    The following portions of the patient's history were reviewed and updated as appropriate: allergies, current medications, past family history, past medical history, past social history, past surgical history, and problem list.  Review of Systems Pertinent items noted in HPI and remainder of comprehensive ROS otherwise negative.   Objective:   Vitals:   05/06/21 0911 05/06/21 0925  BP: (!) 136/95 (!) 137/99  Pulse: 92 87  SpO2: 99%   Weight: 134 lb (60.8 kg)   Height: 5\' 2"  (1.575 m)     General appearance: alert and cooperative Head: Normocephalic, without obvious abnormality, atraumatic Ears: B/L Cerumen impaction Lungs: clear to auscultation bilaterally Heart: regular rate and rhythm, S1, S2 normal, no murmur, click, rub or gallop Abdomen: soft, non-tender; bowel sounds normal; no masses,  no organomegaly Extremities: extremities normal, atraumatic, no cyanosis or edema Neurologic: Alert and oriented X 3, normal strength and tone. Normal symmetric reflexes. Normal coordination and gait    Assessment:    Healthy female exam.  Cerumen impaction Substance dependent   Plan:  Healthy adult I recommended OTC debrox for her ears. F/U ENT as scheduled. She is up to date with influenza vaccine.  She declined COVID 19 shots today. She is up to date with cervical cancer screen. I refilled her Lexapro Smoking and alcohol cessation counseling and support offered.  For HTN: Diastolic BP still elevated Sometimes systolic is elevated at home. Will start her on Norvasc 5 mg qd D/C Micardis. F/U in 4-8 weeks for reassessment. Continue home BP check.    See After Visit Summary for Counseling Recommendations

## 2021-05-06 NOTE — Assessment & Plan Note (Signed)
Diastolic BP still elevated Sometimes systolic is elevated at home. Will start her on Norvasc 5 mg qd D/C Micardis. F/U in 4-8 weeks for reassessment. Continue home BP check.

## 2021-05-06 NOTE — Patient Instructions (Signed)
Managing the Challenge of Quitting Smoking ?Quitting smoking is a physical and mental challenge. You will face cravings, withdrawal symptoms, and temptation. Before quitting, work with your health care provider to make a plan that can help you manage quitting. Preparation can help you quit and keep you from giving in. ?How to manage lifestyle changes ?Managing stress ?Stress can make you want to smoke, and wanting to smoke may cause stress. It is important to find ways to manage your stress. You might try some of the following: ?Practice relaxation techniques. ?Breathe slowly and deeply, in through your nose and out through your mouth. ?Listen to music. ?Soak in a bath or take a shower. ?Imagine a peaceful place or vacation. ?Get some support. ?Talk with family or friends about your stress. ?Join a support group. ?Talk with a counselor or therapist. ?Get some physical activity. ?Go for a walk, run, or bike ride. ?Play a favorite sport. ?Practice yoga. ? ?Medicines ?Talk with your health care provider about medicines that might help you deal with cravings and make quitting easier for you. ?Relationships ?Social situations can be difficult when you are quitting smoking. To manage this, you can: ?Avoid parties and other social situations where people might be smoking. ?Avoid alcohol. ?Leave right away if you have the urge to smoke. ?Explain to your family and friends that you are quitting smoking. Ask for support and let them know you might be a bit grumpy. ?Plan activities where smoking is not an option. ?General instructions ?Be aware that many people gain weight after they quit smoking. However, not everyone does. To keep from gaining weight, have a plan in place before you quit and stick to the plan after you quit. Your plan should include: ?Having healthy snacks. When you have a craving, it may help to: ?Eat popcorn, carrots, celery, or other cut vegetables. ?Chew sugar-free gum. ?Changing how you eat. ?Eat small  portion sizes at meals. ?Eat 4-6 small meals throughout the day instead of 1-2 large meals a day. ?Be mindful when you eat. Do not watch television or do other things that might distract you as you eat. ?Exercising regularly. ?Make time to exercise each day. If you do not have time for a long workout, do short bouts of exercise for 5-10 minutes several times a day. ?Do some form of strengthening exercise, such as weight lifting. ?Do some exercise that gets your heart beating and causes you to breathe deeply, such as walking fast, running, swimming, or biking. This is very important. ?Drinking plenty of water or other low-calorie or no-calorie drinks. Drink 6-8 glasses of water daily. ? ?How to recognize withdrawal symptoms ?Your body and mind may experience discomfort as you try to get used to not having nicotine in your system. These effects are called withdrawal symptoms. They may include: ?Feeling hungrier than normal. ?Having trouble concentrating. ?Feeling irritable or restless. ?Having trouble sleeping. ?Feeling depressed. ?Craving a cigarette. ?To manage withdrawal symptoms: ?Avoid places, people, and activities that trigger your cravings. ?Remember why you want to quit. ?Get plenty of sleep. ?Avoid coffee and other caffeinated drinks. These may worsen some of your symptoms. ?These symptoms may surprise you. But be assured that they are normal to have when quitting smoking. ?How to manage cravings ?Come up with a plan for how to deal with your cravings. The plan should include the following: ?A definition of the specific situation you want to deal with. ?An alternative action you will take. ?A clear idea for how this action   will help. ?The name of someone who might help you with this. ?Cravings usually last for 5-10 minutes. Consider taking the following actions to help you with your plan to deal with cravings: ?Keep your mouth busy. ?Chew sugar-free gum. ?Suck on hard candies or a straw. ?Brush your  teeth. ?Keep your hands and body busy. ?Change to a different activity right away. ?Squeeze or play with a ball. ?Do an activity or a hobby, such as making bead jewelry, practicing needlepoint, or working with wood. ?Mix up your normal routine. ?Take a short exercise break. Go for a quick walk or run up and down stairs. ?Focus on doing something kind or helpful for someone else. ?Call a friend or family member to talk during a craving. ?Join a support group. ?Contact a quitline. ?Where to find support ?To get help or find a support group: ?Call the National Cancer Institute's Smoking Quitline: 1-800-QUIT NOW (784-8669) ?Visit the website of the Substance Abuse and Mental Health Services Administration: www.samhsa.gov ?Text QUIT to SmokefreeTXT: 478848 ?Where to find more information ?Visit these websites to find more information on quitting smoking: ?National Cancer Institute: www.smokefree.gov ?American Lung Association: www.lung.org ?American Cancer Society: www.cancer.org ?Centers for Disease Control and Prevention: www.cdc.gov ?American Heart Association: www.heart.org ?Contact a health care provider if: ?You want to change your plan for quitting. ?The medicines you are taking are not helping. ?Your eating feels out of control or you cannot sleep. ?Get help right away if: ?You feel depressed or become very anxious. ?Summary ?Quitting smoking is a physical and mental challenge. You will face cravings, withdrawal symptoms, and temptation to smoke again. Preparation can help you as you go through these challenges. ?Try different techniques to manage stress, handle social situations, and prevent weight gain. ?You can deal with cravings by keeping your mouth busy (such as by chewing gum), keeping your hands and body busy, calling family or friends, or contacting a quitline for people who want to quit smoking. ?You can deal with withdrawal symptoms by avoiding places where people smoke, getting plenty of rest, and  avoiding drinks with caffeine. ?This information is not intended to replace advice given to you by your health care provider. Make sure you discuss any questions you have with your health care provider. ?Document Revised: 12/03/2020 Document Reviewed: 01/14/2019 ?Elsevier Patient Education ? 2022 Elsevier Inc. ? ?

## 2021-07-01 ENCOUNTER — Encounter: Payer: Self-pay | Admitting: Family Medicine

## 2021-07-01 ENCOUNTER — Other Ambulatory Visit: Payer: Self-pay

## 2021-07-01 ENCOUNTER — Ambulatory Visit (INDEPENDENT_AMBULATORY_CARE_PROVIDER_SITE_OTHER): Payer: No Typology Code available for payment source | Admitting: Family Medicine

## 2021-07-01 VITALS — BP 110/70 | HR 98 | Ht 62.0 in | Wt 129.0 lb

## 2021-07-01 DIAGNOSIS — R519 Headache, unspecified: Secondary | ICD-10-CM | POA: Diagnosis not present

## 2021-07-01 DIAGNOSIS — F339 Major depressive disorder, recurrent, unspecified: Secondary | ICD-10-CM

## 2021-07-01 DIAGNOSIS — I10 Essential (primary) hypertension: Secondary | ICD-10-CM

## 2021-07-01 MED ORDER — KETOROLAC TROMETHAMINE 30 MG/ML IJ SOLN
30.0000 mg | Freq: Once | INTRAMUSCULAR | Status: AC
  Administered 2021-07-01: 30 mg via INTRAMUSCULAR

## 2021-07-01 MED ORDER — MAGNESIUM OXIDE 400 MG PO CAPS: 400.0000 mg | ORAL_CAPSULE | Freq: Every day | ORAL | 99 refills | Status: DC | PRN

## 2021-07-01 MED ORDER — MOMETASONE FUROATE 50 MCG/ACT NA SUSP: 2.0000 | Freq: Every day | NASAL | 6 refills | Status: AC

## 2021-07-01 NOTE — Patient Instructions (Signed)
It was nice seeing you today. I am glad your BP looks good on your current medication. Please, continue Norvasc 5 mg QD for now. ? ?I am sorry about your headache. It sounds like migraine vs sinus headache. Please, continue Ibuprofen or Tylenol as needed for headache and add on Magnesium oxide 400 mg daily prn for headache. I also want you to trial steroid nasal spray. Perhaps these will help. ? ?Keep headache diary and see me soon if there is no improvement. ?

## 2021-07-01 NOTE — Progress Notes (Signed)
? ? ?  SUBJECTIVE:  ? ?CHIEF COMPLAINT / HPI:  ? ?Headache  ?This is a new problem. Episode onset: Started 2 weeks ago. Last week, she had headache 3 times last week. The problem occurs intermittently. The problem has been gradually worsening. The pain is located in the Temporal (Paranasal and temporal pain) region. The pain does not radiate. The pain quality is not similar to prior headaches. The quality of the pain is described as sharp. The pain is at a severity of 8/10. The pain is moderate. Associated symptoms include eye watering, nausea, phonophobia and photophobia. Pertinent negatives include no fever, insomnia or seizures. Nothing aggravates the symptoms. She has tried acetaminophen and NSAIDs for the symptoms. The treatment provided significant relief.  ?Headache is usually during the daytime with light exposure. ? ?HTN: ?She is compliant with Norvasc 5 mg QD. She missed her dose from last night, but overall compliant for the most part. She came in with her BP report over the past weeks.  ? ?Anxiety/Depression: ?She only takes her Lexapro as needed. She feels well not on meds ? ?PERTINENT  PMH / PSH: pmhX REVIEWED ? ?OBJECTIVE:  ? ?BP 110/70   Pulse 98   Ht 5\' 2"  (1.575 m)   Wt 129 lb (58.5 kg)   SpO2 98%   BMI 23.59 kg/m?   ?Physical Exam ?Vitals reviewed.  ?Cardiovascular:  ?   Rate and Rhythm: Normal rate and regular rhythm.  ?   Heart sounds: Normal heart sounds.  ?Pulmonary:  ?   Effort: Pulmonary effort is normal. No respiratory distress.  ?   Breath sounds: Normal breath sounds. No wheezing.  ?Abdominal:  ?   General: Abdomen is flat. Bowel sounds are normal. There is no distension.  ?   Palpations: There is no mass.  ?   Tenderness: There is no abdominal tenderness.  ?Musculoskeletal:  ?   Right lower leg: No edema.  ?   Left lower leg: No edema.  ?Neurological:  ?   General: No focal deficit present.  ?   Mental Status: She is oriented to person, place, and time.  ?   Cranial Nerves: Cranial  nerves 2-12 are intact.  ?   Sensory: Sensation is intact.  ?   Motor: Motor function is intact.  ?   Coordination: Coordination is intact.  ?   Gait: Gait is intact.  ?   Deep Tendon Reflexes: Reflexes are normal and symmetric.  ?Psychiatric:     ?   Thought Content: Thought content does not include homicidal or suicidal ideation.  ? ?Flowsheet Row Office Visit from 07/01/2021 in [facility]  ?PHQ-9 Total Score 6  ? ?  ? ? ? ?ASSESSMENT/PLAN:  ? ?Headache ?Currently symptomatic. ?No neurologic deficit. ?No signs of meningeal irritation. ?Likely Migraine vs Sinus headache. ?Toradol given today. ?May continue Ibuprofen/Tylenol as needed for pain. ?May add Magnesium oxide prn. ?Trial of nasonex for intermittent sinus congestion. ?Keep headache diary and f/u in 2-3 weeks if persistent. ?Otherwise, F/U as needed. ? ?MDD (major depressive disorder) ?Poor adherence to SSRI. ?May hold for now. ?Monitor closely. ? ? ?HTN (hypertension) ?BP looks good. ?Continue Norvasc 5 mg QD for now.  ? ?  ? ?Borgarnes, MD ?[facility]  ? ?

## 2021-07-01 NOTE — Assessment & Plan Note (Signed)
BP looks good. ?Continue Norvasc 5 mg QD for now.  ?

## 2021-07-01 NOTE — Assessment & Plan Note (Signed)
Poor adherence to SSRI. ?May hold for now. ?Monitor closely. ? ?

## 2021-07-01 NOTE — Assessment & Plan Note (Signed)
Currently symptomatic. ?No neurologic deficit. ?No signs of meningeal irritation. ?Likely Migraine vs Sinus headache. ?Toradol given today. ?May continue Ibuprofen/Tylenol as needed for pain. ?May add Magnesium oxide prn. ?Trial of nasonex for intermittent sinus congestion. ?Keep headache diary and f/u in 2-3 weeks if persistent. ?Otherwise, F/U as needed. ?

## 2021-07-25 ENCOUNTER — Other Ambulatory Visit: Payer: Self-pay | Admitting: Family Medicine

## 2021-07-25 ENCOUNTER — Encounter: Payer: Self-pay | Admitting: Family Medicine

## 2021-07-25 MED ORDER — CETIRIZINE HCL 10 MG PO TABS
10.0000 mg | ORAL_TABLET | Freq: Every day | ORAL | 1 refills | Status: DC
Start: 2021-07-25 — End: 2023-07-17

## 2021-09-13 ENCOUNTER — Encounter: Payer: Self-pay | Admitting: *Deleted

## 2021-09-27 ENCOUNTER — Ambulatory Visit: Payer: No Typology Code available for payment source | Admitting: Family Medicine

## 2022-05-23 ENCOUNTER — Telehealth: Payer: Self-pay | Admitting: Family Medicine

## 2022-05-23 NOTE — Telephone Encounter (Signed)
Pt is scheduled on 05/26/2022 @8$ :30 am

## 2022-05-23 NOTE — Telephone Encounter (Signed)
Hello Julia May,  Please contact patient that I received a dental clearance request on her behalf. She need an appointment for this. Her last well adult visit was more than 1 yr ago. Please, help her schedule dental clearance/physical exam appointment.  Thanks.

## 2022-05-26 ENCOUNTER — Other Ambulatory Visit (HOSPITAL_COMMUNITY)
Admission: RE | Admit: 2022-05-26 | Discharge: 2022-05-26 | Disposition: A | Discharge status: Home / Self Care | Payer: BC Managed Care – PPO | Source: Ambulatory Visit | Attending: Family Medicine | Admitting: Family Medicine

## 2022-05-26 ENCOUNTER — Encounter: Payer: Self-pay | Admitting: Family Medicine

## 2022-05-26 ENCOUNTER — Ambulatory Visit (INDEPENDENT_AMBULATORY_CARE_PROVIDER_SITE_OTHER): Payer: BC Managed Care – PPO | Admitting: Family Medicine

## 2022-05-26 VITALS — BP 127/87 | HR 90 | Temp 97.9°F | Ht 62.0 in | Wt 131.2 lb

## 2022-05-26 DIAGNOSIS — Z113 Encounter for screening for infections with a predominantly sexual mode of transmission: Secondary | ICD-10-CM

## 2022-05-26 DIAGNOSIS — Z114 Encounter for screening for human immunodeficiency virus [HIV]: Secondary | ICD-10-CM

## 2022-05-26 DIAGNOSIS — F339 Major depressive disorder, recurrent, unspecified: Secondary | ICD-10-CM

## 2022-05-26 DIAGNOSIS — I10 Essential (primary) hypertension: Secondary | ICD-10-CM

## 2022-05-26 DIAGNOSIS — F17200 Nicotine dependence, unspecified, uncomplicated: Secondary | ICD-10-CM

## 2022-05-26 DIAGNOSIS — Z0000 Encounter for general adult medical examination without abnormal findings: Secondary | ICD-10-CM

## 2022-05-26 MED ORDER — CYCLOBENZAPRINE HCL 5 MG PO TABS: 5.0000 mg | ORAL_TABLET | Freq: Two times a day (BID) | ORAL | 0 refills | Status: DC | PRN

## 2022-05-26 MED ORDER — IBUPROFEN 400 MG PO TABS
400.0000 mg | ORAL_TABLET | Freq: Three times a day (TID) | ORAL | 0 refills | Status: DC | PRN
Start: 2022-05-26 — End: 2023-02-28

## 2022-05-26 MED ORDER — FLUCONAZOLE 150 MG PO TABS: 150.0000 mg | ORAL_TABLET | Freq: Once | ORAL | 0 refills | Status: AC

## 2022-05-26 NOTE — Addendum Note (Signed)
Addended by: Andrena Mews T on: 05/26/2022 09:56 AM   Modules accepted: Orders

## 2022-05-26 NOTE — Assessment & Plan Note (Signed)
No concerns. 

## 2022-05-26 NOTE — Progress Notes (Addendum)
Subjective:     Julia May is a 42 y.o. female and is here for a comprehensive physical exam. The patient reports problems - Vulva itching x 3 days, no discharge, no odor. Sexually active with same partner of 15 yrs, no STD concerns, but will like to get tested. Need dental clearance.  Social History   Socioeconomic History   Marital status: Single    Spouse name: Not on file   Number of children: Not on file   Years of education: Not on file   Highest education level: Not on file  Occupational History   Not on file  Tobacco Use   Smoking status: Every Day    Packs/day: 0.50    Years: 7.00    Total pack years: 3.50    Types: Cigarettes   Smokeless tobacco: Never  Substance and Sexual Activity   Alcohol use: Yes    Alcohol/week: 1.0 standard drink of alcohol    Types: 1 Glasses of wine per week    Comment: quit 38 days ago 03/09/11 todays date   Drug use: Yes    Frequency: 7.0 times per week    Types: Marijuana    Comment: marajuana, cocaine   Sexual activity: Not Currently    Partners: Male    Comment: last month  Other Topics Concern   Not on file  Social History Narrative   Not on file   Social Determinants of Health   Financial Resource Strain: Not on file  Food Insecurity: Not on file  Transportation Needs: Not on file  Physical Activity: Not on file  Stress: Stress Concern Present (09/10/2018)   [place]    Feeling of Stress : Very much  Social Connections: Not on file  Intimate Partner Violence: Not on file   Health Maintenance  Topic Date Due   DTaP/Tdap/Td (2 - Tdap) 03/10/2016   COVID-19 Vaccine (3 - 2023-24 season) 06/11/2022 (Originally 12/09/2021)   INFLUENZA VACCINE  07/09/2022 (Originally 11/08/2021)   PAP SMEAR-Modifier  01/07/2024   Hepatitis C Screening  Completed   HIV Screening  Completed   HPV VACCINES  Aged Out    The following portions of the patient's history  were reviewed and updated as appropriate: allergies, current medications, past family history, past medical history, past social history, past surgical history, and problem list.  Review of Systems Pertinent items noted in HPI and remainder of comprehensive ROS otherwise negative.   Objective:    BP 127/87   Pulse 90   Temp 97.9 F (36.6 C)   Ht 5' 2"$  (1.575 m)   Wt 131 lb 3.2 oz (59.5 kg)   SpO2 100%   BMI 24.00 kg/m  General appearance: alert and cooperative Head: Normocephalic, without obvious abnormality, atraumatic Eyes: conjunctivae/corneas clear. PERRL, EOM's intact. Fundi benign. Ears: normal TM's and external ear canals both ears Throat: No oropharyngeal erythema Lungs: clear to auscultation bilaterally Heart: regular rate and rhythm, S1, S2 normal, no murmur, click, rub or gallop Abdomen: soft, non-tender; bowel sounds normal; no masses,  no organomegaly Pelvic: deferred Lymph nodes: Cervical, supraclavicular, and axillary nodes normal. Neurologic: Alert and oriented X 3, normal strength and tone. Normal symmetric reflexes. Normal coordination and gait    Assessment:    Healthy female exam.     Dental clearance Vaginitis  - She wishes to defer pelvic exam for now. Plan:  Normal exam. She is stable from pulmonary and cardiology standpoint. Dental  form completed, signed by me and her and placed in the front office for faxing. A copy provided to her. Mammogram discussed, she will like to wait till age 86 to start. She is up to date with PAP. Urine cytology for GC/Chlamydia completed as well as HI and RPR screening per her request. Diflucan prescribed empirically for vaginitis. She will return if symptoms persists despite treatment. She declined flu and covid shots as well as Tdap. Cmet and FLP checked today.  NB: She requested a refill of Ibuprofen for intermittent hip pain.  See After Visit Summary for Counseling Recommendations   Andrena Mews, MD [place]

## 2022-05-26 NOTE — Assessment & Plan Note (Signed)
Counseling provided. She is actively working on quitting. Now smokes mostly at nighttime.

## 2022-05-26 NOTE — Assessment & Plan Note (Signed)
BP looks good today. 

## 2022-05-26 NOTE — Patient Instructions (Signed)
Mammogram A mammogram is an X-ray of the breasts. This is done to check for changes that are not normal. This test can look for changes that may be caused by breast cancer or other problems. Mammograms are regularly done on women beginning at age 40. A man may have a mammogram if he has a lump or swelling in his breast. Tell a doctor: About any allergies you have. If you have breast implants. If you have had breast disease, biopsy, or surgery. If you have a family history of breast cancer. If you are breastfeeding. Whether you are pregnant or may be pregnant. What are the risks? Generally, this is a safe procedure. But problems may occur, including: Being exposed to radiation. Radiation levels are very low with this test. The need for more tests. The results were not read properly. Trouble finding breast cancer in women with dense breasts. What happens before the test? Have this test done about 1-2 weeks after your menstrual period. This is often when your breasts are the least tender. If you are visiting a new doctor or clinic, have any past mammogram images sent to your new doctor's office. Wash your breasts and under your arms on the day of the test. Do not use deodorants, perfumes, lotions, or powders on the day of the test. Take off any jewelry from your neck. Wear clothes that you can change into and out of easily. What happens during the test?  You will take off your clothes from the waist up. You will put on a gown. You will stand in front of the X-ray machine. Each breast will be placed between two plastic or glass plates. The plates will press down on your breast for a few seconds. Try to relax. This does not cause any harm to your breasts. It may not feel comfortable, but it will be very brief. X-rays will be taken from different angles of each breast. The procedure may vary among doctors and hospitals. What can I expect after the test? The mammogram will be read by a  specialist (radiologist). You may need to do parts of the test again. This depends on the quality of the images. You may go back to your normal activities. It is up to you to get the results of your test. Ask how to get your results when they are ready. Summary A mammogram is an X-ray of the breasts. It looks for changes that may be caused by breast cancer or other problems. A man may have this test if he has a lump or swelling in his breast. Before the test, tell your doctor about any breast problems that you have had in the past. Have this test done about 1-2 weeks after your menstrual period. Ask when your test results will be ready. Make sure you get your test results. This information is not intended to replace advice given to you by your health care provider. Make sure you discuss any questions you have with your health care provider. Document Revised: 12/08/2020 Document Reviewed: 01/26/2020 Elsevier Patient Education  2023 Elsevier Inc.  

## 2022-05-29 ENCOUNTER — Telehealth: Payer: Self-pay | Admitting: Family Medicine

## 2022-05-29 LAB — URINE CYTOLOGY ANCILLARY ONLY
Bacterial Vaginitis-Urine: NEGATIVE
Candida Urine: POSITIVE — AB
Chlamydia: NEGATIVE
Comment: NEGATIVE
Comment: NEGATIVE
Comment: NORMAL
Neisseria Gonorrhea: NEGATIVE
Trichomonas: NEGATIVE

## 2022-05-29 NOTE — Telephone Encounter (Signed)
Patient returns call to nurse line.   Results given to patient.

## 2022-05-29 NOTE — Telephone Encounter (Signed)
HIPAA compliant callback message left.  Please advise when she calls.  Sodium level is mildly elevated. This sometimes occurs due to dehydration. Encourage hydration and recheck in a few weeks. HIV test is negative. Your other STD tests are pending.   Dr. Johnette Abraham

## 2022-05-30 ENCOUNTER — Encounter: Payer: Self-pay | Admitting: Family Medicine

## 2022-06-02 ENCOUNTER — Encounter: Payer: Self-pay | Admitting: Family Medicine

## 2022-06-02 LAB — CMP14+EGFR
ALT: 28 IU/L (ref 0–32)
AST: 24 IU/L (ref 0–40)
Albumin/Globulin Ratio: 1.7 (ref 1.2–2.2)
Albumin: 4.3 g/dL (ref 3.9–4.9)
Alkaline Phosphatase: 72 IU/L (ref 44–121)
BUN/Creatinine Ratio: 11 (ref 9–23)
BUN: 8 mg/dL (ref 6–24)
Bilirubin Total: <0.2 mg/dL (ref 0.0–1.2)
CO2: 25 mmol/L (ref 20–29)
Calcium: 8.9 mg/dL (ref 8.7–10.2)
Chloride: 106 mmol/L (ref 96–106)
Creatinine, Ser: 0.74 mg/dL (ref 0.57–1.00)
Globulin, Total: 2.6 g/dL (ref 1.5–4.5)
Glucose: 82 mg/dL (ref 70–99)
Potassium: 4.3 mmol/L (ref 3.5–5.2)
Sodium: 146 mmol/L — ABNORMAL HIGH (ref 134–144)
Total Protein: 6.9 g/dL (ref 6.0–8.5)
eGFR: 105 mL/min/{1.73_m2} (ref >59)

## 2022-06-02 LAB — HIV ANTIBODY (ROUTINE TESTING W REFLEX): HIV Screen 4th Generation wRfx: NONREACTIVE

## 2022-06-02 LAB — LIPID PANEL
Chol/HDL Ratio: 2.4 ratio (ref 0.0–4.4)
Cholesterol, Total: 156 mg/dL (ref 100–199)
HDL: 64 mg/dL (ref >39)
LDL Chol Calc (NIH): 70 mg/dL (ref 0–99)
Triglycerides: 123 mg/dL (ref 0–149)
VLDL Cholesterol Cal: 22 mg/dL (ref 5–40)

## 2022-06-02 LAB — RPR W/REFLEX TO TREPSURE: RPR: NONREACTIVE

## 2022-06-02 LAB — T PALLIDUM ANTIBODY, EIA: T pallidum Antibody, EIA: NEGATIVE

## 2022-06-20 ENCOUNTER — Telehealth: Payer: Self-pay

## 2022-06-20 ENCOUNTER — Other Ambulatory Visit: Payer: Self-pay | Admitting: Family Medicine

## 2022-06-20 MED ORDER — ONDANSETRON 4 MG PO TBDP: 4.0000 mg | ORAL_TABLET | Freq: Three times a day (TID) | ORAL | 0 refills | Status: AC | PRN

## 2022-06-20 NOTE — Telephone Encounter (Signed)
Patient calls nurse line regarding nausea, vomiting and diarrhea. She reports that symptoms have been going on for the last three days. She reports receiving a Zofran injection at work earlier today. She denies blood in stool. She also reports a decreased appetite. She has been drinking plenty of fluids.   She is requesting prescription for Zofran as well as antidiarrheal.   Offered to schedule appointment, however, patient declines due to PCP not having availability and concern for exposing others to illness.   Provided with supportive measures. Will forward to PCP for further advisement.   Talbot Grumbling, RN

## 2022-06-20 NOTE — Telephone Encounter (Signed)
Spoke with patient informed of note left by provider. Salvatore Marvel, CMA

## 2022-06-23 ENCOUNTER — Encounter: Payer: Self-pay | Admitting: Family Medicine

## 2022-06-23 ENCOUNTER — Telehealth: Payer: BC Managed Care – PPO | Admitting: Family Medicine

## 2022-06-23 VITALS — BP 134/80 | Temp 98.3°F | Wt 127.0 lb

## 2022-06-23 DIAGNOSIS — K529 Noninfective gastroenteritis and colitis, unspecified: Secondary | ICD-10-CM | POA: Diagnosis not present

## 2022-06-23 NOTE — Telephone Encounter (Signed)
Scheduled virtual appointment for this afternoon with Dr. Oleh Genin.   Talbot Grumbling, RN

## 2022-06-23 NOTE — Progress Notes (Signed)
[facility] Tele[facility] Visit  Patient consented to have virtual visit and was identified by name and date of birth. Method of visit: Telephone  Encounter participants: Patient: Julia May - located at home Provider: Rise Patience - located at [facility]   Chief Complaint: GI Illness  HPI:  Tuesday started having vomiting and diarrhea and was given Zofran and sent home. The patient didn't want to go to the office because she was concerned about spreading her infection. Is needing a letter to go back on Monday, she states her job doesn't need the letter. Stools are now solid and patient is able to tolerate eating and drinking well.  ROS: per HPI  Pertinent PMHx: None  Exam:  BP 134/80   Temp 98.3 F (36.8 C) (Oral)   Wt 127 lb (57.6 kg)   BMI 23.23 kg/m   Respiratory: speaking in full sentences comfortably  Assessment/Plan:  Gastroenteritis Patient had episodes of diarrhea and vomiting that seem consistent with viral gastroenteritis earlier this week.  Patient stated away from work in order to avoid spreading the GI illness.  Patient has now recovered, note provided to go back to work on Monday.  Time spent during visit with patient: 5 minutes

## 2022-06-23 NOTE — Telephone Encounter (Signed)
Patient returns call to nurse line. She is requesting excuse letter for work for time missed related to GI virus.  Patient is requesting excuse from Tuesday, 3/12 with return on 3/18.  Reports that she is doing better, however, is still having symptoms.   Will forward to PCP.   Talbot Grumbling, RN

## 2022-07-26 ENCOUNTER — Other Ambulatory Visit: Payer: Self-pay | Admitting: Family Medicine

## 2022-08-01 ENCOUNTER — Encounter: Payer: Self-pay | Admitting: Family Medicine

## 2022-08-11 ENCOUNTER — Encounter: Payer: Self-pay | Admitting: Family Medicine

## 2022-08-11 NOTE — Progress Notes (Signed)
Form is missing patient's ROI signature.  I messaged her via MyChart to come in to sign the form before we can complete it.  I placed a partially completed copy without my signature on it in the front office for her to pick up and sign. She will then return it for completion.

## 2022-10-02 ENCOUNTER — Ambulatory Visit (HOSPITAL_COMMUNITY)
Admission: EM | Admit: 2022-10-02 | Discharge: 2022-10-02 | Disposition: A | Discharge status: Home / Self Care | Payer: BC Managed Care – PPO | Attending: Physician Assistant | Admitting: Physician Assistant

## 2022-10-02 ENCOUNTER — Ambulatory Visit (INDEPENDENT_AMBULATORY_CARE_PROVIDER_SITE_OTHER): Payer: BC Managed Care – PPO

## 2022-10-02 ENCOUNTER — Encounter (HOSPITAL_COMMUNITY): Payer: Self-pay

## 2022-10-02 DIAGNOSIS — Z23 Encounter for immunization: Secondary | ICD-10-CM | POA: Diagnosis not present

## 2022-10-02 DIAGNOSIS — S92511B Displaced fracture of proximal phalanx of right lesser toe(s), initial encounter for open fracture: Secondary | ICD-10-CM

## 2022-10-02 MED ORDER — TETANUS-DIPHTH-ACELL PERTUSSIS 5-2.5-18.5 LF-MCG/0.5 IM SUSY
0.5000 mL | PREFILLED_SYRINGE | Freq: Once | INTRAMUSCULAR | Status: AC
  Administered 2022-10-02: 0.5 mL via INTRAMUSCULAR

## 2022-10-02 MED ORDER — IBUPROFEN 800 MG PO TABS
ORAL_TABLET | ORAL | Status: AC
  Filled 2022-10-02: qty 1

## 2022-10-02 MED ORDER — TETANUS-DIPHTH-ACELL PERTUSSIS 5-2.5-18.5 LF-MCG/0.5 IM SUSY
PREFILLED_SYRINGE | INTRAMUSCULAR | Status: AC
  Filled 2022-10-02: qty 0.5

## 2022-10-02 MED ORDER — IBUPROFEN 800 MG PO TABS
800.0000 mg | ORAL_TABLET | Freq: Once | ORAL | Status: AC
  Administered 2022-10-02: 800 mg via ORAL

## 2022-10-02 MED ORDER — AMOXICILLIN-POT CLAVULANATE 875-125 MG PO TABS: 1.0000 | ORAL_TABLET | Freq: Two times a day (BID) | ORAL | 0 refills | Status: DC

## 2022-10-02 MED ORDER — HYDROCODONE-ACETAMINOPHEN 5-325 MG PO TABS: 0.5000 | ORAL_TABLET | Freq: Two times a day (BID) | ORAL | 0 refills | Status: DC | PRN

## 2022-10-02 NOTE — Discharge Instructions (Signed)
You have a severely broken right little toe.  Because of the wound I am starting you on antibiotics.  Take Augmentin twice daily.  We updated your tetanus today.  Keep this area clean with soap and water but do not use any alcohol, hydrogen peroxide, Neosporin.  I have called in a few doses of hydrocodone for pain relief.  This will make you sleepy so do not drive or drink alcohol taking it.  This is addictive so try to limit use as much as possible.  It is very important that you follow-up with podiatry tomorrow.  You have an appointment set up either at 2:00 PM.  If anything worsens overnight go to the emergency room as we discussed.

## 2022-10-02 NOTE — ED Provider Notes (Signed)
[facility]    CSN: [id number] Arrival date & time: 10/02/22  1023      History   Chief Complaint Chief Complaint  Patient presents with   Foot Injury    HPI Julia May is a 42 y.o. female.   Patient presents today with a several day history of severe right foot pain.  She reports that she was on vacation when she was going up a set of stairs at night and injured her left toe.  She believes this got caught between 2 pieces of wood and pulled on her right toe.  She has noticed a wound in between the fourth and fifth toe and has been cleaning this with hydrogen peroxide and applying Neosporin with lidocaine.  She is unsure when her last tetanus was.  She reports that the pain is rated "25" on a 0-10 pain scale, localized to her fifth toe with radiation into the foot, described as throbbing/aching, no alleviating factors identified.  She has been using RICE protocol as well as Tylenol and ibuprofen without improvement of symptoms.  Denies previous injury or surgery involving her foot.  Denies history of diabetes.  She is confident that she is not pregnant.    Past Medical History:  Diagnosis Date   Amenorrhea 11/17/2016   ANEMIA, IRON DEFICIENCY, UNSPEC. 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 No pertinent surgical history.  OB History   No obstetric history on file.      Home Medications    Prior to Admission medications   Medication Sig Start Date End Date Taking? Authorizing Provider  amLODipine (NORVASC) 5 MG tablet TAKE 1 TABLET(5 MG) BY MOUTH AT BEDTIME 07/26/22  Yes Janit Pagan  T, MD  amoxicillin-clavulanate (AUGMENTIN) 875-125 MG tablet Take 1 tablet by mouth every 12 (twelve) hours. 10/02/22  Yes Miri Jose K, PA-C  cetirizine (ZYRTEC) 10 MG tablet Take 1 tablet (10 mg total) by mouth daily. 07/25/21  Yes Janit Pagan T, MD  escitalopram (LEXAPRO) 10 MG tablet TAKE 1 TABLET(10 MG) BY MOUTH DAILY Strength: 10 mg 05/06/21  Yes Doreene Eland, MD  HYDROcodone-acetaminophen (NORCO/VICODIN) 5-325 MG tablet Take 0.5-1 tablets by mouth 2 (two) times daily as needed. 10/02/22  Yes Shonta Bourque K, PA-C  ibuprofen (ADVIL) 400 MG tablet Take 1 tablet (400 mg total) by mouth every 8 (eight) hours as needed. 05/26/22  Yes Doreene Eland, MD  traZODone (DESYREL) 50 MG tablet Take 1 tablet (50 mg total) by mouth at bedtime as needed for sleep. 05/06/21  Yes Doreene Eland, MD  valACYclovir (VALTREX) 500 MG tablet Take 1 tablet (500 mg total) by mouth daily. 05/06/21  Yes Doreene Eland, MD  albuterol (VENTOLIN HFA) 108 (90 Base) MCG/ACT inhaler Inhale 2 puffs into the lungs every 4 (four) hours as needed for wheezing or shortness of breath. Patient not taking: Reported on 07/01/2021 05/06/21   Doreene Eland, MD  cyclobenzaprine (FLEXERIL) 5 MG tablet Take 1 tablet (5 mg total) by mouth 2 (two) times daily as needed for muscle spasms. 05/26/22   Janit Pagan  T, MD  hydrOXYzine (ATARAX) 50 MG tablet Take 1 tablet (50 mg total) by mouth every 8 (eight) hours as needed for anxiety. Do not use when operating machinery or driving Patient not taking: Reported on 07/01/2021 05/06/21   Doreene Eland, MD  mometasone (NASONEX) 50 MCG/ACT nasal spray Place 2 sprays into the nose daily. 07/01/21   Doreene Eland, MD    Family History History reviewed. No pertinent family history.  Social History Social History   Tobacco Use   Smoking status: Every Day    Packs/day: 0.50    Years: 7.00    Additional pack years: 0.00    Total pack years: 3.50    Types: Cigarettes    Smokeless tobacco: Never  Substance Use Topics   Alcohol use: Yes    Alcohol/week: 1.0 standard drink of alcohol    Types: 1 Glasses of wine per week    Comment: quit 38 days ago 03/09/11 todays date   Drug use: Yes    Frequency: 7.0 times per week    Types: Marijuana    Comment: marajuana, cocaine     Allergies   Patient has no known allergies.   Review of Systems Review of Systems  Constitutional:  Positive for activity change. Negative for appetite change, fatigue and fever.  Gastrointestinal:  Negative for abdominal pain, diarrhea, nausea and vomiting.  Musculoskeletal:  Positive for arthralgias and gait problem. Negative for joint swelling and myalgias.  Skin:  Positive for wound. Negative for color change.  Neurological:  Negative for weakness and numbness.     Physical Exam Triage Vital Signs ED Triage Vitals  Enc Vitals Group     BP 10/02/22 1255 (!) 163/111     Pulse Rate 10/02/22 1255 76     Resp 10/02/22 1255 19     Temp 10/02/22 1255 98.2 F (36.8 C)     Temp Source 10/02/22 1255 Oral     SpO2 10/02/22 1255 99 %     Weight 10/02/22 1255 127 lb (57.6 kg)     Height 10/02/22 1255 5\' 2"  (1.575 m)     Head Circumference --      Peak Flow --      Pain Score 10/02/22 1252 10     Pain Loc --      Pain Edu? --      Excl. in GC? --    No data found.  Updated Vital Signs BP (!) 163/111 (BP Location: Left Arm)   Pulse 76   Temp 98.2 F (36.8 C) (Oral)   Resp 19   Ht 5\' 2"  (1.575 m)   Wt 127 lb (57.6 kg)   SpO2 99%   BMI 23.23 kg/m   Visual Acuity Right Eye Distance:   Left Eye Distance:   Bilateral Distance:    Right Eye Near:   Left Eye Near:    Bilateral Near:     Physical Exam Vitals reviewed.  Constitutional:      General: She is awake. She is not in acute distress.    Appearance: Normal appearance. She is well-developed. She is not ill-appearing.     Comments: Very pleasant female appears stated age in no acute distress sitting  comfortably in exam room  HENT:     Head: Normocephalic and atraumatic.  Cardiovascular:     Rate and Rhythm: Normal rate and regular rhythm.     Heart sounds: Normal heart sounds, S1 normal and S2 normal. No murmur heard.  Comments: Capillary refill within 2 seconds right toes. Pulmonary:     Effort: Pulmonary effort is normal.     Breath sounds: Normal breath sounds. No wheezing, rhonchi or rales.  Musculoskeletal:     Right foot: Normal range of motion. No deformity or bunion.  Feet:     Right foot:     Protective Sensation: 10 sites tested.  10 sites sensed.     Toenail Condition: Right toenails are normal.     Comments: Right foot: Normal active range of motion at ankle and foot.  Tenderness palpation over fifth metacarpal and fifth toe.  No deformity noted.  Wound noted along base of fifth toe and interdigital space with associated maceration. Psychiatric:        Behavior: Behavior is cooperative.      UC Treatments / Results  Labs (all labs ordered are listed, but only abnormal results are displayed) Labs Reviewed - No data to display  EKG   Radiology DG Foot Complete Right  Result Date: 10/02/2022 CLINICAL DATA:  Injury 2 days ago.  Pain and deformity. EXAM: RIGHT FOOT COMPLETE - 3+ VIEW COMPARISON:  None Available. FINDINGS: Fracture dislocation of the inter phalangeal joint of the small toe. The majority of the distal phalanx is displaced laterally and there is a fracture of the proximal medial corner of the distal phalanx which stays in its normal position. No other abnormality. IMPRESSION: Fracture dislocation of the interphalangeal joint of the small toe. The majority of the distal phalanx is displaced laterally and there is a fracture of the proximal medial corner of the distal phalanx which stays in its normal position. Electronically Signed   By: Paulina Fusi M.D.   On: 10/02/2022 13:15    Procedures Procedures (including critical care time)  Medications  Ordered in UC Medications  ibuprofen (ADVIL) tablet 800 mg (800 mg Oral Given 10/02/22 1315)  Tdap (BOOSTRIX) injection 0.5 mL (0.5 mLs Intramuscular Given 10/02/22 1315)    Initial Impression / Assessment and Plan / UC Course  I have reviewed the triage vital signs and the nursing notes.  Pertinent labs & imaging results that were available during my care of the patient were reviewed by me and considered in my medical decision making (see chart for details).     Patient is well-appearing, afebrile, nontoxic, nontachycardic.  X-ray was obtained that showed fracture dislocation of interphalangeal joint of the small toe.  Given associated laceration on the base of her toe this is considered an open fracture.  Tetanus was updated.  Will start Augmentin twice daily.  Call and schedule appointment to try foot and ankle for tomorrow at 2 PM.  Discussed the importance of following up with specialist.  She was placed in a postop shoe for comfort and support but buddy taping was deferred as patient had severe pain with even slight manipulation of the toe.  She was given hydrocodone for pain relief.  Review of [place] controlled substance database shows no inappropriate refills.  We discussed that this is sedating and she should not drive or drink alcohol while taking it.  We also discussed that this is addictive and she should limit use is much as possible.  Did discuss case with Dr. Marlinda Mike who agreed with treatment plan.  Discussed that if she has any worsening or changing symptoms she should go to the emergency room overnight.  Strict return precautions given.  Excuse note provided.  Final Clinical Impressions(s) / UC Diagnoses   Final diagnoses:  Open displaced fracture of proximal phalanx of lesser toe of right foot, initial encounter     Discharge Instructions      You have a severely broken right little toe.  Because of the wound I am starting you on antibiotics.  Take Augmentin twice  daily.  We updated your tetanus today.  Keep this area clean with soap and water but do not use any alcohol, hydrogen peroxide, Neosporin.  I have called in a few doses of hydrocodone for pain relief.  This will make you sleepy so do not drive or drink alcohol taking it.  This is addictive so try to limit use as much as possible.  It is very important that you follow-up with podiatry tomorrow.  You have an appointment set up either at 2:00 PM.  If anything worsens overnight go to the emergency room as we discussed.     ED Prescriptions     Medication Sig Dispense Auth. Provider   amoxicillin-clavulanate (AUGMENTIN) 875-125 MG tablet Take 1 tablet by mouth every 12 (twelve) hours. 14 tablet Priyanka Causey K, PA-C   HYDROcodone-acetaminophen (NORCO/VICODIN) 5-325 MG tablet Take 0.5-1 tablets by mouth 2 (two) times daily as needed. 4 tablet Rip Hawes K, PA-C      I have reviewed the PDMP during this encounter.   Jeani Hawking, PA-C 10/02/22 1340

## 2022-10-02 NOTE — ED Triage Notes (Signed)
Patient here today with c/o injury to her right foot on Saturday. Patient was standing on a porch and her foot got stuck in between some wooden post. She states that she has a cut in between her 4th and 5th toe. She also has some swelling and bruise. She took some Tylenol and IBU with no relief. She put an ace wrap on her foot with some improvement. RICE therapy helps.

## 2022-10-03 ENCOUNTER — Ambulatory Visit (INDEPENDENT_AMBULATORY_CARE_PROVIDER_SITE_OTHER): Payer: BC Managed Care – PPO

## 2022-10-03 ENCOUNTER — Ambulatory Visit (INDEPENDENT_AMBULATORY_CARE_PROVIDER_SITE_OTHER): Payer: BC Managed Care – PPO | Admitting: Podiatry

## 2022-10-03 ENCOUNTER — Encounter: Payer: Self-pay | Admitting: Podiatry

## 2022-10-03 ENCOUNTER — Telehealth: Payer: Self-pay

## 2022-10-03 ENCOUNTER — Other Ambulatory Visit: Payer: Self-pay | Admitting: Family Medicine

## 2022-10-03 DIAGNOSIS — S93104A Unspecified dislocation of right toe(s), initial encounter: Secondary | ICD-10-CM

## 2022-10-03 DIAGNOSIS — R600 Localized edema: Secondary | ICD-10-CM | POA: Diagnosis not present

## 2022-10-03 DIAGNOSIS — S93114A Dislocation of interphalangeal joint of right lesser toe(s), initial encounter: Secondary | ICD-10-CM

## 2022-10-03 DIAGNOSIS — R262 Difficulty in walking, not elsewhere classified: Secondary | ICD-10-CM | POA: Diagnosis not present

## 2022-10-03 DIAGNOSIS — S92531S Displaced fracture of distal phalanx of right lesser toe(s), sequela: Secondary | ICD-10-CM

## 2022-10-03 MED ORDER — MELOXICAM 7.5 MG PO TABS
7.5000 mg | ORAL_TABLET | Freq: Every day | ORAL | 0 refills | Status: DC
Start: 2022-10-03 — End: 2023-03-27

## 2022-10-03 NOTE — Telephone Encounter (Signed)
Knee scooter DME ordered. Forwarded to the RN team to process the request.

## 2022-10-03 NOTE — Telephone Encounter (Signed)
Patient calls nurse line requesting DME order for knee scooter/walker.   She had a recent injury with toe fracture.   Will forward to PCP.   Veronda Prude, RN

## 2022-10-03 NOTE — Telephone Encounter (Signed)
Community message sent to [employer]. Will await response.   Taejah Ohalloran C Dianah Pruett, RN  

## 2022-10-03 NOTE — Progress Notes (Signed)
Chief Complaint  Patient presents with   Foot Injury    Pt came in because she injured her foot Saturday. Looks like she went somewhere else and they gave her a surgical boot also took xrays. She has been icing her Right foot and elevating it. She was given amoxicillin    HPI: 42 y.o. female presenting today after injuring her foot on 09/30/22.  She went to the ED on 10/02/22 for this.  Her injury occurred when going up a set of stair at night, but she is not sure of the exact mechanism of injury.  She thinks the toe got stuck between wood planks on the stairs.  She has severe pain of the toe.  In the ED, her tetanus status was updated, and was started on Augmentin.  The toe could not be splinted secondary to pain at that time.  She was given hydrocodone for pain management according to ED report.  She notes there is an opening between the 4th and 5th toes right foot.  No current treatment provided for this.  Past Medical History:  Diagnosis Date   Amenorrhea 11/17/2016   ANEMIA, IRON DEFICIENCY, UNSPEC. 06/07/2006   Qualifier: Diagnosis of  By: Gavin Potters MD, HEIDI     Blurred vision, right eye 11/30/2015   No past surgical history on file.  No Known Allergies   Physical Exam:  General: The patient is alert and oriented x3 in no acute distress.  Dermatology: Skin is warm, dry and supple bilateral lower extremities. Macerated right 4th interspace with small superficial opening / laceration present.    Vascular: Palpable pedal pulses bilaterally. Capillary refill within normal limits. Edema right 5th toe.  Neurological: Light touch sensation grossly intact bilateral feet.   Musculoskeletal Exam: Significant pain on palpation to right 5th toe.  Edematous toe.  Toe is in good position.  Unable to assess ROM secondary to pain.  No pop of 5th metatarsal.  Radiographic Exam (post - reduction 3 views, right foot, 10/05/22):  5th toe not fully reduced, but improved position from ED views at level  of PIPJ.    Assessment/Plan of Care: 1. Dislocation of interphalangeal joint of lesser toe of right foot, initial encounter   2. Dislocation of phalanx of right foot, initial encounter     Meds ordered this encounter  Medications   meloxicam (MOBIC) 7.5 MG tablet    Sig: Take 1 tablet (7.5 mg total) by mouth daily.    Dispense:  30 tablet    Refill:  0   Discussed clinical & radiographic findings with patient today.  With the patient's consent, due to the significant pain she was having in the toe, the right 5th toe was anesthetized with 1% lidocaine plain for a total of 3cc's administered, after a sterile skin prep was performed.  Once anesthesia was confirmed, attempt was made to close-reduce the dislocation.  Radiographs taken post-reduction to see if reduced, but not fully.  Unable to reduce further.  The 5th toe was splinted in a fashion to gradually apply constant pressure to help reduce the toe over the next couple of weeks. Patient shown how to change this splint using Coban.  Will recheck in 2 weeks.  The toe is in good position grossly.   While the toe was anesthetized, the interspace was evaluated, and there was maceration in the interspace.  Small superficial ulceration noted, but unsure if this was initially an ulcerated corn.  No active drainage or purulence.  Betadine applied  to interspace and patient instructed to do this daily using a Q-tip, prior to rewrapping/splinting the toe.  She was informed the toe may have suffered a skin tear due to the sudden severe lateral force applied to the toe at the time of injury. Continue with oral antibiotics due to this being considered an open dislocation/fracture.   She needs to continue wearing the surgical shoe to prevent any bending / flexion of the 5th toe over the next two weeks.  Rx meloxicam 7.5mg  every day for 30 days sent to her pharmacy.  Discussed switching to pneumatic camwalker if more stability is needed. She'll call the office  if she wants to switch to a camwalker boot.   If the toe does not completely reduce, as long as her symptoms continue to improve, there is minimal need to proceed with surgical correction, since the toe is in good position.  Her pain will dictate whether surgery will be indicated.     Clerance Lav, DPM, FACFAS [facility]     [address] [place], [place] [address]                Office [phone]  Fax [phone]

## 2022-10-03 NOTE — Telephone Encounter (Signed)
Receipt confirmed by [employer].   Lovinia Snare C Caleen Taaffe, RN  

## 2022-10-17 ENCOUNTER — Encounter: Payer: Self-pay | Admitting: *Deleted

## 2022-10-24 ENCOUNTER — Ambulatory Visit (INDEPENDENT_AMBULATORY_CARE_PROVIDER_SITE_OTHER): Payer: BC Managed Care – PPO | Admitting: Podiatry

## 2022-10-24 ENCOUNTER — Encounter: Payer: Self-pay | Admitting: Podiatry

## 2022-10-24 ENCOUNTER — Ambulatory Visit (INDEPENDENT_AMBULATORY_CARE_PROVIDER_SITE_OTHER): Payer: BC Managed Care – PPO

## 2022-10-24 DIAGNOSIS — R600 Localized edema: Secondary | ICD-10-CM

## 2022-10-24 DIAGNOSIS — S93114D Dislocation of interphalangeal joint of right lesser toe(s), subsequent encounter: Secondary | ICD-10-CM

## 2022-10-24 DIAGNOSIS — S92911D Unspecified fracture of right toe(s), subsequent encounter for fracture with routine healing: Secondary | ICD-10-CM

## 2022-10-24 DIAGNOSIS — R262 Difficulty in walking, not elsewhere classified: Secondary | ICD-10-CM | POA: Diagnosis not present

## 2022-10-24 NOTE — Progress Notes (Signed)
Chief Complaint  Patient presents with   Toe Pain     Pt Dislocated   interphalangeal joint of lesser toe of right foot,  the toe still hurt. She was on her feet all day yesterday which made it worse.     HPI: 42 y.o. female presents today for f/u of dislocated 5th toe right foot.  Patient notes that she still has moderate to occasional severe pain in the right fifth toe.  She is wearing the surgical shoe.  She states that her employer has requested that she be on her feet more than what was recommended in her original work note.  She notes that when she has to be on her feet for good portion of the day her toe is significantly tender and throbbing and she is virtually limping by the end of the day.  She does acknowledge that the swelling has improved since last seen.  The pain has improved as well.  Past Medical History:  Diagnosis Date   Amenorrhea 11/17/2016   ANEMIA, IRON DEFICIENCY, UNSPEC. 06/07/2006   Qualifier: Diagnosis of  By: Gavin Potters MD, HEIDI     Blurred vision, right eye 11/30/2015    History reviewed. No pertinent surgical history.  No Known Allergies   Physical Exam: There were no vitals filed for this visit.  General: The patient is alert and oriented x3 in no acute distress.  Dermatology: Skin is warm, dry and supple bilateral lower extremities. Interspaces are clear of maceration and debris.  Localized edema right fifth toe.  No erythema or ecchymosis at this time.  Vascular: Palpable pedal pulses bilaterally. Capillary refill within normal limits.  No appreciable edema.  No erythema or calor.  Neurological: Light touch sensation grossly intact bilateral feet.   Musculoskeletal Exam: There is pain on palpation to the right fifth toe.  There is pain with attempted range of motion of the right fifth toe.  Radiographic Exam right foot, 3 weightbearing views, 10/24/2022:  There is reduction of the dislocation of the interphalangeal joint of the right fifth toe.   It is in good position but not anatomic position.  The middle phalanx is still slightly lateral to the main line of the proximal phalanx.  There is an avulsion fracture of the proximal medial aspect of the middle phalanx base.  This is in fair position this is an intra-articular fracture  Assessment/Plan of Care: 1. Closed fracture dislocation of toe of right foot with routine healing, subsequent encounter   2. Dislocation of interphalangeal joint of lesser toe of right foot, subsequent encounter   3. Localized edema   4. Difficulty walking     PR PNEUMAT WALKING BOOT PRE CST  Discussed clinical findings with patient today.  Will keep her immobilized for another 3 weeks to allow for more healing time and accommodating the toe due to the swelling.  Will place her into a pneumatic cam walker, prefabricated so that ambulation will be a little bit more comfortable than in the surgical shoe.  She was in agreement.  She was placed in a size small pneumatic cam walker.  This was fitted to her today.  She will wear this at all times awake and weightbearing.  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  Continue with elevation, rest is much as possible, icing  as needed.   Clerance Lav, DPM, FACFAS [facility]     [address] [place], [place] [address]                Office [phone]  Fax [phone]

## 2022-11-01 ENCOUNTER — Telehealth: Payer: Self-pay | Admitting: Podiatry

## 2022-11-01 NOTE — Telephone Encounter (Signed)
Pt wanted me to inform you that she was forced to quit her job today because her employer refused to adhere to work restrictions given for her condition. She wanted to know if there was anything she could in regards to this matter. Please advise

## 2022-11-14 ENCOUNTER — Ambulatory Visit (INDEPENDENT_AMBULATORY_CARE_PROVIDER_SITE_OTHER): Payer: BC Managed Care – PPO

## 2022-11-14 ENCOUNTER — Ambulatory Visit (INDEPENDENT_AMBULATORY_CARE_PROVIDER_SITE_OTHER): Payer: BC Managed Care – PPO | Admitting: Podiatry

## 2022-11-14 DIAGNOSIS — S92524D Nondisplaced fracture of medial phalanx of right lesser toe(s), subsequent encounter for fracture with routine healing: Secondary | ICD-10-CM

## 2022-11-14 DIAGNOSIS — S93114D Dislocation of interphalangeal joint of right lesser toe(s), subsequent encounter: Secondary | ICD-10-CM | POA: Diagnosis not present

## 2022-11-14 NOTE — Progress Notes (Unsigned)
      Chief Complaint  Patient presents with   Toe Injury   HPI: 42 y.o. female presents today for follow-up of right fifth toe dislocation/fracture.  She noted that due to needing to be out of work secondary to the swelling and pain and intolerance of shoe gear because of the injury, she was terminated from her employment.  She is currently looking for new employment.  She notes that the toe is feeling much better now.  She continues to wrap the toe daily and states that this helps a lot.  Past Medical History:  Diagnosis Date   Amenorrhea 11/17/2016   ANEMIA, IRON DEFICIENCY, UNSPEC. 06/07/2006   Qualifier: Diagnosis of  By: Gavin Potters MD, HEIDI     Blurred vision, right eye 11/30/2015   No past surgical history on file.  No Known Allergies   Physical Exam: There were no vitals filed for this visit.  General: The patient is alert and oriented x3 in no acute distress.  Dermatology: No ecchymosis or erythema noted.  No open lesions  Vascular: Palpable pedal pulses right foot. Capillary refill within normal limits.    Neurological: Light touch sensation grossly intact right foot Musculoskeletal Exam: Right fifth toe edema has improved significantly.  There is minimal edema at this time.  Minimal pain on palpation to the fifth toe near the PIP joint.  No pain near the metatarsal phalangeal joint.  Some discomfort with range of motion of the PIP joint.  Toe is in rectus position.  Radiographic Exam (right foot, 3 weightbearing views, 11/14/2022):  Normal osseous mineralization.  The dislocation at the PIP joint has fully reduced.  The fragment at the base of the middle phalanx is in good position and shows signs of healing.  Assessment/Plan of Care: 1. Dislocation of interphalangeal joint of lesser toe of right foot, subsequent encounter   2. Closed nondisplaced fracture of middle phalanx of lesser toe of right foot with routine healing, subsequent encounter     Discussed clinical and  radiographic findings with patient today.  She will continue with toes plantar between the fourth and fifth toes for support and edema control.  She will do this for 1-2 more weeks.  She may return to regular shoe gear as tolerated.  Will hold off on any high-impact exercises for another 2 weeks.  Follow-up as needed  Clerance Lav, DPM, FACFAS [facility]     [address] [place], [place] [address]                Office [phone]  Fax [phone]

## 2022-11-16 DIAGNOSIS — S92504D Nondisplaced unspecified fracture of right lesser toe(s), subsequent encounter for fracture with routine healing: Secondary | ICD-10-CM | POA: Insufficient documentation

## 2022-11-16 DIAGNOSIS — S93114A Dislocation of interphalangeal joint of right lesser toe(s), initial encounter: Secondary | ICD-10-CM | POA: Insufficient documentation

## 2022-12-04 ENCOUNTER — Ambulatory Visit (INDEPENDENT_AMBULATORY_CARE_PROVIDER_SITE_OTHER): Payer: Self-pay | Admitting: Podiatry

## 2022-12-04 ENCOUNTER — Encounter: Payer: Self-pay | Admitting: Family Medicine

## 2022-12-04 ENCOUNTER — Ambulatory Visit (INDEPENDENT_AMBULATORY_CARE_PROVIDER_SITE_OTHER): Payer: Self-pay

## 2022-12-04 ENCOUNTER — Ambulatory Visit: Payer: Self-pay

## 2022-12-04 DIAGNOSIS — M7061 Trochanteric bursitis, right hip: Secondary | ICD-10-CM

## 2022-12-04 DIAGNOSIS — S93491A Sprain of other ligament of right ankle, initial encounter: Secondary | ICD-10-CM

## 2022-12-04 DIAGNOSIS — M779 Enthesopathy, unspecified: Secondary | ICD-10-CM

## 2022-12-04 DIAGNOSIS — R600 Localized edema: Secondary | ICD-10-CM

## 2022-12-04 DIAGNOSIS — M25471 Effusion, right ankle: Secondary | ICD-10-CM

## 2022-12-04 DIAGNOSIS — S8264XA Nondisplaced fracture of lateral malleolus of right fibula, initial encounter for closed fracture: Secondary | ICD-10-CM

## 2022-12-04 DIAGNOSIS — M79671 Pain in right foot: Secondary | ICD-10-CM

## 2022-12-04 DIAGNOSIS — M25571 Pain in right ankle and joints of right foot: Secondary | ICD-10-CM

## 2022-12-04 MED ORDER — HYDROCODONE-ACETAMINOPHEN 5-325 MG PO TABS
1.0000 | ORAL_TABLET | ORAL | 0 refills | Status: AC | PRN
Start: 2022-12-04 — End: 2022-12-11

## 2022-12-04 NOTE — Progress Notes (Unsigned)
Chief Complaint  Patient presents with   Foot Injury   HPI: 42 y.o. female presents today with concern of right ankle and foot pain.  States this began within the last couple of days.  States that she was walking and accidentally rolled the ankle outward while stepping and had immediate significant pain on the outside of the right ankle.  She has a knee scooter today.  States that its extremely painful to try to bear weight on the foot.  Past Medical History:  Diagnosis Date   Amenorrhea 11/17/2016   ANEMIA, IRON DEFICIENCY, UNSPEC. 06/07/2006   Qualifier: Diagnosis of  By: Gavin Potters MD, HEIDI     Blurred vision, right eye 11/30/2015   No past surgical history on file.  No Known Allergies   Physical Exam: General: The patient is alert and oriented x3 in no acute distress.  Dermatology: Mild ecchymosis to the lateral aspect of the right ankle.  No open lesions  Vascular: Palpable pedal pulses bilaterally. Capillary refill within normal limits.  Significant edema to the lateral aspect of the ankle and dorsal lateral aspect of the right midfoot.  Neurological: Light touch sensation grossly intact bilateral feet.   Musculoskeletal Exam: Significant pain on palpation to the fibula near the ankle joint.  There is pain on attempted palpation of the lateral ankle ligaments x 3.  Minimal pain on palpation of the medial ankle/medial malleolus.  Attempted ankle range of motion was too painful for the patient.  Attempted inversion of the foot was too painful to perform.  No pain on palpation of the fifth metatarsal or the fifth metatarsal head or base.  Right fifth toe has no pain on palpation where she had her previous dislocation.  Radiographic Exam (right foot and ankle views):  Normal osseous mineralization.  There is an oblique fracture line running from the posterior superior aspect of the fibula, superior to the ankle joint, extending anterior and inferior, ending at the level of the ankle  joint.  There is minimal to no posterior displacement of the inferior fragment.  No other fractures are seen  Assessment/Plan of Care: 1. Closed nondisplaced fracture of lateral malleolus of right fibula, initial encounter   2. Right foot pain   3. High ankle sprain of right lower extremity, initial encounter   4. Acute right ankle pain   5. Localized edema   6. Edema of right ankle      Meds ordered this encounter  Medications   HYDROcodone-acetaminophen (NORCO/VICODIN) 5-325 MG tablet    Sig: Take 1 tablet by mouth every 4 (four) hours as needed for up to 7 days for severe pain.    Dispense:  25 tablet    Refill:  0   PR PNEUMAT WALKING BOOT PRE CST DG FOOT COMPLETE RIGHT  Discussed clinical and radiographic findings with patient today.  She was informed that she has a closed, nondisplaced fracture of the fibula on the right ankle.  She will need to be immobilized for a minimum period of 4 weeks.  Will opt to immobilize in a high pneumatic cam walking boot.  Patient noted that she does have a short pneumatic cam walker but due to the high ankle sprain and pain near the superior aspect of the fibula we will need to provide immobilization higher than what the surety walking boot permits.  This is why the previous cam walker will not be sufficient for immobilization for this particular injury.  As she was fitted  and dispensed a high leg pneumatic cam walker today.  She was shown how to apply this.  She will wear this at all times only removing for bathing.  It was pointed out that she needs to wear this while sleeping.  If she feels this is too uncomfortable or cannot keep this on as recommended, she can opt to come in to have a below-knee cast applied.  She notes that she started a new job, which begins in 2 weeks.  But begins with classroom training for 2 weeks.  She is hoping that this will be healed by 4 weeks.  She will use her knee scooter that she had today to help maintain  nonweightbearing for at least the first 2 to 3 weeks.  Prescription for hydrocodone-acetaminophen 5-325 mg was sent in to manage her pain.  Patient instructed to ice and elevate the right foot and ankle.  She can stick an ice pack down in the cam walker for 15 to 20 minutes several times per day.   Clerance Lav, DPM, FACFAS [facility]     [address] [place], [place] [address]                Office [phone]  Fax [phone]

## 2022-12-13 ENCOUNTER — Encounter: Payer: Self-pay | Admitting: Podiatry

## 2022-12-13 ENCOUNTER — Other Ambulatory Visit: Payer: Self-pay | Admitting: Podiatry

## 2022-12-13 DIAGNOSIS — S8264XA Nondisplaced fracture of lateral malleolus of right fibula, initial encounter for closed fracture: Secondary | ICD-10-CM

## 2022-12-13 NOTE — Progress Notes (Signed)
Wrote order for bone stim / Exogen

## 2022-12-13 NOTE — Telephone Encounter (Signed)
Called to schedule patient appointment but no answer.  was unable to leave a voicemail.

## 2022-12-15 ENCOUNTER — Telehealth: Payer: Self-pay | Admitting: Podiatry

## 2022-12-15 NOTE — Telephone Encounter (Signed)
Pt left message on 9/4 at 718am stating she had left a paper on 8/30 for Dr Carlota Raspberry to fill out and was needing it back asap.  I checked chart and there was a letter written and I did call pt to confirm what she needed was taken care of and she said yes.

## 2022-12-28 ENCOUNTER — Encounter: Payer: Self-pay | Admitting: Podiatry

## 2023-01-01 ENCOUNTER — Ambulatory Visit: Payer: Self-pay | Admitting: Podiatry

## 2023-01-16 ENCOUNTER — Ambulatory Visit (INDEPENDENT_AMBULATORY_CARE_PROVIDER_SITE_OTHER): Payer: Managed Care, Other (non HMO) | Admitting: Podiatry

## 2023-01-16 ENCOUNTER — Encounter: Payer: Self-pay | Admitting: Podiatry

## 2023-01-16 ENCOUNTER — Ambulatory Visit (INDEPENDENT_AMBULATORY_CARE_PROVIDER_SITE_OTHER): Payer: Managed Care, Other (non HMO)

## 2023-01-16 DIAGNOSIS — S8264XD Nondisplaced fracture of lateral malleolus of right fibula, subsequent encounter for closed fracture with routine healing: Secondary | ICD-10-CM

## 2023-01-16 NOTE — Progress Notes (Signed)
      Chief Complaint  Patient presents with   Fracture    Right ankle, still having sharpe/shooting pain, level pain 3/10    HPI: 42 y.o. female presents today for follow-up of right fibular fracture.  She has been nonweightbearing using a knee scooter and wearing a cam walker for the past 4 weeks.  Rates her pain 3/10 on the right.  States that she occasionally gets some radiating and burning sensations of the lateral aspect of the leg.  Past Medical History:  Diagnosis Date   Amenorrhea 11/17/2016   ANEMIA, IRON DEFICIENCY, UNSPEC. 06/07/2006   Qualifier: Diagnosis of  By: Gavin Potters MD, HEIDI     Blurred vision, right eye 11/30/2015   No past surgical history on file.  No Known Allergies   Physical Exam: General: The patient is alert and oriented x3 in no acute distress.  Dermatology: Skin is warm, dry and supple bilateral lower extremities. Interspaces are clear of maceration and debris.  No open lesions are noted.  No signs of skin irritation from the cam walker noted  Vascular: Palpable pedal pulses bilaterally. Capillary refill within normal limits.  Mild localized edema to the lateral right ankle.   Neurological: Light touch sensation grossly intact bilateral feet.   Musculoskeletal Exam: No pain on palpation of the medial right ankle.  There is pain on palpation of the distal portion of the right fibula as well as the lateral ankle ligaments.  Did not attempt range of motion secondary to pain.  Edema in the area has significantly decreased from what was present 4 weeks ago  Radiographic Exam (right ankle, 3 partial weightbearing views, 01/16/2023):  Small amount of bone callus formation is seen at the superior portion of the fracture line on the fibula.  Can still see the lucency of the fracture line obliquely on the lateral view and the fibula.  No new fractures or stress reactions were observed.  Assessment/Plan of Care: 1. Closed nondisplaced fracture of lateral malleolus of  right fibula with routine healing, subsequent encounter     DG ANKLE COMPLETE RIGHT  Discussed clinical and radiographic findings with patient today.  Discussed approximate healing times for fractures with the patient.  She notes that she is a cigarette smoker and she was informed that this can delay healing up to 8-10 times normal expected rates.  She was asked to curtail her smoking habits over the next few weeks and increase her calcium and vitamin D consumption and.  I will reevaluate in 2 weeks.  She will stay nonweightbearing in the cam walker.  Did inform her that she can bear some weight at home in the cam walker.  A new note was given for work to continue being nonweightbearing, using the knee scooter, and wearing the cam boot  Follow-up 2 weeks   Maxime Beckner DBurna Mortimer, DPM, FACFAS [facility]     [address] [place], [place] [address]                Office [phone]  Fax [phone]

## 2023-01-16 NOTE — Addendum Note (Signed)
Addended by: Daryel November on: 01/16/2023 10:50 AM   Modules accepted: Orders

## 2023-01-30 ENCOUNTER — Encounter: Payer: Self-pay | Admitting: Podiatry

## 2023-01-30 ENCOUNTER — Ambulatory Visit (INDEPENDENT_AMBULATORY_CARE_PROVIDER_SITE_OTHER): Payer: Managed Care, Other (non HMO)

## 2023-01-30 ENCOUNTER — Ambulatory Visit (INDEPENDENT_AMBULATORY_CARE_PROVIDER_SITE_OTHER): Payer: Managed Care, Other (non HMO) | Admitting: Student

## 2023-01-30 ENCOUNTER — Ambulatory Visit: Payer: Self-pay | Admitting: Family Medicine

## 2023-01-30 ENCOUNTER — Other Ambulatory Visit (HOSPITAL_COMMUNITY)
Admission: RE | Admit: 2023-01-30 | Discharge: 2023-01-30 | Disposition: A | Discharge status: Home / Self Care | Payer: Managed Care, Other (non HMO) | Source: Ambulatory Visit | Attending: Family Medicine | Admitting: Family Medicine

## 2023-01-30 ENCOUNTER — Ambulatory Visit: Payer: Managed Care, Other (non HMO) | Admitting: Podiatry

## 2023-01-30 VITALS — BP 125/102 | HR 94 | Ht 62.0 in | Wt 129.8 lb

## 2023-01-30 DIAGNOSIS — S8264XG Nondisplaced fracture of lateral malleolus of right fibula, subsequent encounter for closed fracture with delayed healing: Secondary | ICD-10-CM

## 2023-01-30 DIAGNOSIS — I10 Essential (primary) hypertension: Secondary | ICD-10-CM | POA: Diagnosis not present

## 2023-01-30 DIAGNOSIS — R262 Difficulty in walking, not elsewhere classified: Secondary | ICD-10-CM

## 2023-01-30 DIAGNOSIS — Z113 Encounter for screening for infections with a predominantly sexual mode of transmission: Secondary | ICD-10-CM

## 2023-01-30 NOTE — Assessment & Plan Note (Signed)
BP today elevated.  Per patient she has been noncompliant with her medication and currently on 5 mg amlodipine daily.  Home BP readings have been mostly in the 140s/90s.  Patient currently asymptomatic denies any headache, vision changes, or chest pain. -Increase patient's amlodipine to 10 mg daily -Ordered lab for BMP -Encourage compliance to medication -Recommend keeping daily BP log -Follow-up in 2 weeks to reassess blood pressure and at that time will determine need for additional therapy.

## 2023-01-30 NOTE — Progress Notes (Signed)
Annual Wellness Visit     Patient: Julia May, Female    DOB: [birth year], 41 y.o.   MRN: [id number]  Subjective  Chief Complaint  Patient presents with   physical    std testing    Blood Pressure Check    Julia May is a 42 y.o. female who presents today for her Annual Wellness Visit.  Diet: Regular diet no restriction Sleep: average 4-5 hours with difficulty staying asleep or sleeping through the night Exercise: No but active with work Alcohol use: A bottler  on the weekends  Tobacco use:  1/2 a pack a day for 20years  Illicit drug use: Marijuana daily  Sexually active: Two partner built consistent with condom  Works as: Engineer, site M-F  Lives with:Husband and Dog  Code Status: Full code Power of Attorney: None  Medical concerns: Elevated Bps. 140s/90s  Medications: Outpatient Medications Prior to Visit  Medication Sig   albuterol (VENTOLIN HFA) 108 (90 Base) MCG/ACT inhaler Inhale 2 puffs into the lungs every 4 (four) hours as needed for wheezing or shortness of breath.   amLODipine (NORVASC) 5 MG tablet TAKE 1 TABLET(5 MG) BY MOUTH AT BEDTIME   amoxicillin-clavulanate (AUGMENTIN) 875-125 MG tablet Take 1 tablet by mouth every 12 (twelve) hours.   cetirizine (ZYRTEC) 10 MG tablet Take 1 tablet (10 mg total) by mouth daily.   cyclobenzaprine (FLEXERIL) 5 MG tablet Take 1 tablet (5 mg total) by mouth 2 (two) times daily as needed for muscle spasms.   escitalopram (LEXAPRO) 10 MG tablet TAKE 1 TABLET(10 MG) BY MOUTH DAILY Strength: 10 mg   hydrOXYzine (ATARAX) 50 MG tablet Take 1 tablet (50 mg total) by mouth every 8 (eight) hours as needed for anxiety. Do not use when operating machinery or driving   ibuprofen (ADVIL) 400 MG tablet Take 1 tablet (400 mg total) by mouth every 8 (eight) hours as needed.   meloxicam (MOBIC) 7.5 MG tablet Take 1 tablet (7.5 mg total) by mouth daily.   mometasone (NASONEX) 50 MCG/ACT nasal spray Place 2 sprays into the nose  daily.   traZODone (DESYREL) 50 MG tablet Take 1 tablet (50 mg total) by mouth at bedtime as needed for sleep.   valACYclovir (VALTREX) 500 MG tablet Take 1 tablet (500 mg total) by mouth daily.   No facility-administered medications prior to visit.    No Known Allergies   Objective  BP (!) 125/102   Pulse 94   Ht 5\' 2"  (1.575 m)   Wt 129 lb 12.8 oz (58.9 kg)   SpO2 100%   BMI 23.74 kg/m   Physical Exam Constitutional:      Appearance: Normal appearance. She is normal weight.  HENT:     Right Ear: Tympanic membrane normal.     Left Ear: Tympanic membrane normal.  Eyes:     Extraocular Movements: Extraocular movements intact.     Conjunctiva/sclera: Conjunctivae normal.     Pupils: Pupils are equal, round, and reactive to light.  Cardiovascular:     Rate and Rhythm: Normal rate and regular rhythm.     Pulses: Normal pulses.     Heart sounds: Normal heart sounds.  Pulmonary:     Effort: Pulmonary effort is normal.     Breath sounds: Normal breath sounds.  Abdominal:     General: Abdomen is flat. Bowel sounds are normal.     Palpations: Abdomen is soft.  Genitourinary:    General: Normal vulva.  Musculoskeletal:  Cervical back: Normal range of motion and neck supple.  Skin:    General: Skin is warm and dry.     Capillary Refill: Capillary refill takes less than 2 seconds.  Neurological:     General: No focal deficit present.     Mental Status: She is alert and oriented to person, place, and time. Mental status is at baseline.  Psychiatric:        Mood and Affect: Mood normal.        Behavior: Behavior normal.        Thought Content: Thought content normal.        Judgment: Judgment normal.      Most recent depression screenings:    01/30/2023    3:30 PM 06/23/2022    2:51 PM  PHQ 2/9 Scores  PHQ - 2 Score 1 0  PHQ- 9 Score 7 5    Assessment & Plan   42 year old female generally healthy without any concerns.  Indulges in chronic tobacco use for 20  years and alcohol use on the weekend. Counseling provided.  Annual wellness visit done today including the all of the following: Reviewed patient's Family Medical History Reviewed and updated list of patient's medical providers Assessment of cognitive impairment was done Assessed patient's functional ability Established a written schedule for health screening services Health Risk Assessent Completed and Reviewed  Exercise Activities and Dietary recommendations  Goals   None     Immunization History  Administered Date(s) Administered   Influenza-Unspecified 02/07/2015   PFIZER(Purple Top)SARS-COV-2 Vaccination 08/03/2019, 08/29/2019   Td 03/10/2006   Tdap 10/02/2022    Health Maintenance  Topic Date Due   INFLUENZA VACCINE  11/09/2022   COVID-19 Vaccine (3 - 2023-24 season) 12/10/2022   Cervical Cancer Screening (HPV/Pap Cotest)  01/07/2024   DTaP/Tdap/Td (3 - Td or Tdap) 10/01/2032   Hepatitis C Screening  Completed   HIV Screening  Completed   HPV VACCINES  Aged Out     Discussed health benefits of physical activity, and encouraged her to engage in regular exercise appropriate for her age and condition.    Problem List Items Addressed This Visit       Cardiovascular and Mediastinum   HTN (hypertension)    BP today elevated.  Per patient she has been noncompliant with her medication and currently on 5 mg amlodipine daily.  Home BP readings have been mostly in the 140s/90s.  Patient currently asymptomatic denies any headache, vision changes, or chest pain. -Increase patient's amlodipine to 10 mg daily -Ordered lab for BMP -Encourage compliance to medication -Recommend keeping daily BP log -Follow-up in 2 weeks to reassess blood pressure and at that time will determine need for additional therapy.      Relevant Orders   Basic Metabolic Panel   Other Visit Diagnoses     Screening examination for STD (sexually transmitted disease)    -  Primary   Sexually active  and requesting STD testing. Currently asymptomatic. -Cervical swab for GC/, chlamydia, trichomonas and wet prep. -HIV RPR ordered.   Relevant Orders   HIV antibody (with reflex)   RPR   Cervicovaginal ancillary only       Return in about 3 weeks (around 02/20/2023).     Jerre Simon, MD

## 2023-01-30 NOTE — Progress Notes (Unsigned)
   Chief Complaint  Patient presents with   Routine Post Op    PATIENT STATES IT IS LIKE GOOD DAYS AND BAD DAYS , IT HAS BEEN PAINFUL BECAUSE SHE IS ON HER FEET A LOT BUT SHE PUTS ICE ON IT AND IT USUALLY TAKES AWAY PAIN.     HPI: 42 y.o. female presents today for follow-up of right fibular fracture.  She is wearing her cam walker.  She notes that she has been fairly active since last seen.  She notes that there is still pain in the area but it has improved.  Past Medical History:  Diagnosis Date   Amenorrhea 11/17/2016   ANEMIA, IRON DEFICIENCY, UNSPEC. 06/07/2006   Qualifier: Diagnosis of  By: Gavin Potters MD, HEIDI     Blurred vision, right eye 11/30/2015   History reviewed. No pertinent surgical history.  No Known Allergies   Physical Exam: General: The patient is alert and oriented x3 in no acute distress.  Dermatology: Skin is warm, dry and supple bilateral lower extremities. Interspaces are clear of maceration and debris.    Vascular: Palpable pedal pulses bilaterally. Capillary refill within normal limits.  Mild right lateral ankle edema.  No erythema or calor.  No ecchymosis  Neurological: Light touch sensation grossly intact bilateral feet.   Musculoskeletal Exam: Pain on palpation lateral aspect of the right fibula near the level of the ankle joint.  Radiographic Exam (right ankle, 3 weightbearing views, 01/30/2023):  Normal osseous mineralization.  The fracture at the fibula does show signs of bone callus formation.  There is radiolucency along the fracture line which is still easily visible at this time.  Assessment/Plan of Care: 1. Closed nondisplaced fracture of lateral malleolus of right fibula with delayed healing, subsequent encounter   2. Difficulty walking     Discussed clinical and radiographic findings with patient today.  Informed her that there is a bit of delay on healing at this point but there are radiographic signs showing that it has improved no due to some  bone callus formation noted.  As she became emotional regarding calluses affecting her work.  She notes that she would like to stay out of work for the rest this week so that she can completely stay off the foot to help it heal.  Encourage patient to discontinue smoking if possible and make sure she is taking calcium supplementation and vitamin D to aid in bone healing.  As she may return to work light duty starting next week as long as she continues in the cam walker.  Will rex-ray in 2 weeks.  Continue with current restrictions  She was given a work note to stay out of work for the rest of the week.  Clerance Lav, DPM, FACFAS [facility]     [address] [place], [place] [address]                Office [phone]  Fax [phone]

## 2023-01-30 NOTE — Patient Instructions (Signed)
It was wonderful to meet you today. Thank you for allowing me to be a part of your care. Below is a short summary of what we discussed at your visit today:  Pressure today is elevated.  I recommend increasing your dose to 10 mg of amlodipine daily.  It would be very helpful if you consistent with your medication.  Please check your blood pressure daily and keep a blood pressure log which we will reassess in 2-3 weeks.  If BP is elevated at that time we will look to add a second medication to what you are currently taking.  Today we also obtained lab to for STD testing including gonorrhea, chlamydia, HIV and syphilis.   If you have any questions or concerns, please do not hesitate to contact us via phone or MyChart message.   Jerre Simon, MD [facility]

## 2023-01-31 LAB — BASIC METABOLIC PANEL
BUN/Creatinine Ratio: 18 (ref 9–23)
BUN: 13 mg/dL (ref 6–24)
CO2: 26 mmol/L (ref 20–29)
Calcium: 9.9 mg/dL (ref 8.7–10.2)
Chloride: 101 mmol/L (ref 96–106)
Creatinine, Ser: 0.74 mg/dL (ref 0.57–1.00)
Glucose: 81 mg/dL (ref 70–99)
Potassium: 4.3 mmol/L (ref 3.5–5.2)
Sodium: 142 mmol/L (ref 134–144)
eGFR: 104 mL/min/{1.73_m2} (ref >59)

## 2023-01-31 LAB — RPR: RPR Ser Ql: NONREACTIVE

## 2023-01-31 LAB — HIV ANTIBODY (ROUTINE TESTING W REFLEX): HIV Screen 4th Generation wRfx: NONREACTIVE

## 2023-02-01 ENCOUNTER — Telehealth: Payer: Self-pay

## 2023-02-01 ENCOUNTER — Encounter: Payer: Self-pay | Admitting: Student

## 2023-02-01 ENCOUNTER — Other Ambulatory Visit: Payer: Self-pay | Admitting: Student

## 2023-02-01 DIAGNOSIS — N760 Acute vaginitis: Secondary | ICD-10-CM

## 2023-02-01 LAB — CERVICOVAGINAL ANCILLARY ONLY
Bacterial Vaginitis (gardnerella): POSITIVE — AB
Candida Glabrata: NEGATIVE
Candida Vaginitis: NEGATIVE
Chlamydia: NEGATIVE
Comment: NEGATIVE
Comment: NEGATIVE
Comment: NEGATIVE
Comment: NEGATIVE
Comment: NEGATIVE
Comment: NORMAL
Neisseria Gonorrhea: NEGATIVE
Trichomonas: NEGATIVE

## 2023-02-01 MED ORDER — METRONIDAZOLE 500 MG PO TABS
500.0000 mg | ORAL_TABLET | Freq: Two times a day (BID) | ORAL | 0 refills | Status: DC
Start: 2023-02-01 — End: 2023-02-01

## 2023-02-01 MED ORDER — METRONIDAZOLE 500 MG PO TABS
500.0000 mg | ORAL_TABLET | Freq: Two times a day (BID) | ORAL | 0 refills | Status: AC
Start: 2023-02-01 — End: 2023-02-08

## 2023-02-01 NOTE — Progress Notes (Signed)
Flagyl sent for BV.

## 2023-02-01 NOTE — Telephone Encounter (Signed)
Patient calls nurse line regarding positive BV results.   She is requesting that treatment be sent to [facility] on [address].   She states that she is able to tolerate the oral pills.   Veronda Prude, RN

## 2023-02-13 ENCOUNTER — Encounter: Payer: Self-pay | Admitting: Podiatry

## 2023-02-13 ENCOUNTER — Ambulatory Visit (INDEPENDENT_AMBULATORY_CARE_PROVIDER_SITE_OTHER): Payer: Managed Care, Other (non HMO)

## 2023-02-13 ENCOUNTER — Ambulatory Visit (INDEPENDENT_AMBULATORY_CARE_PROVIDER_SITE_OTHER): Payer: Managed Care, Other (non HMO) | Admitting: Podiatry

## 2023-02-13 DIAGNOSIS — S8264XG Nondisplaced fracture of lateral malleolus of right fibula, subsequent encounter for closed fracture with delayed healing: Secondary | ICD-10-CM

## 2023-02-13 DIAGNOSIS — M7751 Other enthesopathy of right foot: Secondary | ICD-10-CM

## 2023-02-13 NOTE — Progress Notes (Signed)
   Chief Complaint  Patient presents with   Routine Post Op    PATIENT STATES THAT HER RIGHT ANKLE HAS BEEN A LOT BETTER , A LITTLE PAIN HERE AND THERE BUT NOTHING SHE CAN'T HANDLE. SHE STATES SHE IS READY FOR THE BOOT TO COME OFF.    HPI: 42 y.o. female presents today for f/u of right fibular fracture.  She's been wearing the camwalker as instructed, but over this weekend, she did try to walk very short distances without it and says she had no pain.  She feels she is ready to return to work without the pneumatic walking boot.  States the area feels a little weak.  Past Medical History:  Diagnosis Date   Amenorrhea 11/17/2016   ANEMIA, IRON DEFICIENCY, UNSPEC. 06/07/2006   Qualifier: Diagnosis of  By: Gavin Potters MD, HEIDI     Blurred vision, right eye 11/30/2015   History reviewed. No pertinent surgical history.  No Known Allergies   Physical Exam: Palpable pedal pulses right foot.  No edema right ankle.  No pain with gentle ROM of ankle and STJ.  Minimal to no pain along fibula.  Radiographic Exam (right ankle, 3wb views, 02/13/23):  Normal osseous mineralization. Continued bone callus formation along fibular fracture with evidence of bone healing noted.  Fracture line still visible, but improving with regard to evidence of healing.  Assessment/Plan of Care: 1. Closed nondisplaced fracture of lateral malleolus of right fibula with delayed healing, subsequent encounter     Discussed clinical & radiographic findings with patient today.  Patient was fitted for a small right ankle brace to support the ankle as she returns to regular shoe gear.  She may have to return to her camwalker once home from work for a few days while adjusting to the brace and having less overall support.  Or, she can just apply an Ace wrap once home from work for milder support when she is in a more relaxed environment with less walking involved.  The M.A. was instructed to give her an ace wrap to take with her today.    F/u prn    Clerance Lav, DPM, FACFAS [facility]     [address] [place], [place] [address]                Office [phone]  Fax [phone]

## 2023-02-28 ENCOUNTER — Encounter: Payer: Self-pay | Admitting: Student

## 2023-02-28 ENCOUNTER — Other Ambulatory Visit: Payer: Self-pay | Admitting: Family Medicine

## 2023-02-28 MED ORDER — CYCLOBENZAPRINE HCL 5 MG PO TABS: 5.0000 mg | ORAL_TABLET | Freq: Two times a day (BID) | ORAL | 0 refills | Status: AC | PRN

## 2023-02-28 MED ORDER — IBUPROFEN 400 MG PO TABS: 400.0000 mg | ORAL_TABLET | Freq: Three times a day (TID) | ORAL | 0 refills | Status: AC | PRN

## 2023-03-02 ENCOUNTER — Encounter: Payer: Self-pay | Admitting: Family Medicine

## 2023-03-19 ENCOUNTER — Encounter: Payer: Self-pay | Admitting: Podiatry

## 2023-03-19 ENCOUNTER — Ambulatory Visit: Payer: Managed Care, Other (non HMO) | Admitting: Podiatry

## 2023-03-19 ENCOUNTER — Ambulatory Visit (INDEPENDENT_AMBULATORY_CARE_PROVIDER_SITE_OTHER): Payer: Managed Care, Other (non HMO)

## 2023-03-19 DIAGNOSIS — S8264XG Nondisplaced fracture of lateral malleolus of right fibula, subsequent encounter for closed fracture with delayed healing: Secondary | ICD-10-CM

## 2023-03-19 NOTE — Progress Notes (Signed)
   Chief Complaint  Patient presents with   Ankle Pain    Follow up fibula fracture right   "Its feeling better"   HPI: 42 y.o. female presents today for follow-up of right fibular fracture.  She has been wearing her ankle brace and feels much more supported with little to no pain when wearing it.  She has returned to work without restrictions and is doing well.  She does note some achiness to the area by the end of the workday.  Past Medical History:  Diagnosis Date   Amenorrhea 11/17/2016   ANEMIA, IRON DEFICIENCY, UNSPEC. 06/07/2006   Qualifier: Diagnosis of  By: Gavin Potters MD, HEIDI     Blurred vision, right eye 11/30/2015   No past surgical history on file.  No Known Allergies   Physical Exam: Palpable pedal pulses right foot.  No edema noted to the lateral right ankle.  No pain on palpation to the fibula.  Minimal discomfort on palpation to the peroneal tendons just posterior to the lateral malleolus.  Resisted eversion of the foot does produce some pain/weakness (-5/5).  Radiographic Exam (right ankle, 3 WB views, 03/19/23):  Normal osseous mineralization. Joint spaces preserved.  Right fibular fracture continues to show good signs of healing with more bone callus formation noted.  Still in near anatomic position.  Assessment/Plan of Care: 1. Closed nondisplaced fracture of lateral malleolus of right fibula with delayed healing, subsequent encounter    Discussed clinical & radiographic findings with patient today.  Patient will continue with her ankle brace but may slowly wean out of it as she begins performing some isolated strengthening exercises to the peroneal tendons.  We discussed using a resistance band to isolate the peroneal muscles twice daily.  If she does feel the need for physical therapy and a more structured format.  She can reach out and we can get her referred.  She like to try to avoid this for now.  Follow-up as needed.  She was given a work note for missing work  this morning   Clerance Lav, DPM, FACFAS [facility]     [address] [place], [place] [address]                Office [phone]  Fax [phone]

## 2023-03-22 ENCOUNTER — Encounter: Payer: Self-pay | Admitting: Family Medicine

## 2023-03-23 ENCOUNTER — Encounter: Payer: Self-pay | Admitting: Family Medicine

## 2023-03-23 NOTE — Telephone Encounter (Signed)
Patient is scheduled for 12/17 @850 

## 2023-03-27 ENCOUNTER — Encounter: Payer: Self-pay | Admitting: Family Medicine

## 2023-03-27 ENCOUNTER — Ambulatory Visit: Payer: Managed Care, Other (non HMO) | Admitting: Family Medicine

## 2023-03-27 VITALS — BP 122/82 | HR 94 | Ht 62.0 in | Wt 132.5 lb

## 2023-03-27 DIAGNOSIS — H00011 Hordeolum externum right upper eyelid: Secondary | ICD-10-CM

## 2023-03-27 DIAGNOSIS — H00019 Hordeolum externum unspecified eye, unspecified eyelid: Secondary | ICD-10-CM | POA: Insufficient documentation

## 2023-03-27 HISTORY — DX: Hordeolum externum unspecified eye, unspecified eyelid: H00.019

## 2023-03-27 MED ORDER — ERYTHROMYCIN 5 MG/GM OP OINT: TOPICAL_OINTMENT | OPHTHALMIC | 0 refills | Status: AC

## 2023-03-27 NOTE — Assessment & Plan Note (Addendum)
Right eyelid stye No vision impairment Erythromycin ointment escribed Continue warm compresses prn F/U soon if symptoms worsens She agreed with the plan

## 2023-03-27 NOTE — Progress Notes (Signed)
    SUBJECTIVE:   CHIEF COMPLAINT / HPI:   Eye Problem  Affected eye: Right eyelid swollen, painful and irritated. This is a new problem. Episode onset: Symptoms started 2 weeks ago. The problem occurs constantly. The problem has been gradually worsening (Her right eyelid is gradually getting bigger in size). There was no injury mechanism. The pain is at a severity of 3/10. The pain is mild. 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 She Does not wear contacts. Associated symptoms include itching. Pertinent negatives include no blurred vision, double vision or fever. Associated symptoms comments: Last night she had a leak of liquid from her right eyelid. Eyelid is mildly pink. No pink eye. Treatments tried: Warm compresses. She used OTC stye eye cream. The treatment provided mild relief.     PERTINENT  PMH / PSH: PMHx reviewed  OBJECTIVE:   BP 122/82   Pulse 94   Ht 5\' 2"  (1.575 m)   Wt 132 lb 8 oz (60.1 kg)   SpO2 99%   BMI 24.23 kg/m   Physical Exam Vitals and nursing note reviewed.  Constitutional:      Appearance: Normal appearance.  HENT:     Right Ear: Ear canal normal. There is impacted cerumen.     Left Ear: Ear canal normal. There is impacted cerumen.  Eyes:     General:        Left eye: No discharge.     Extraocular Movements: Extraocular movements intact.     Conjunctiva/sclera: Conjunctivae normal.     Pupils: Pupils are equal, round, and reactive to light.     Comments: Right upper eyelid swollen, mildly tender, mildly erythematous with a open center over her eyelid bump  Pulmonary:     Effort: No respiratory distress.  Neurological:     General: No focal deficit present.     Mental Status: She is alert.      ASSESSMENT/PLAN:   Stye Right eyelid stye No vision impairment Erythromycin ointment escribed Continue warm compresses prn F/U soon if symptoms worsens She agreed with the plan     Janit Pagan, MD [facility]

## 2023-03-27 NOTE — Patient Instructions (Signed)
Stye A stye, also known as a hordeolum, is a bump that forms on an eyelid. It may look like a pimple next to the eyelash. A stye can form inside the eyelid (internal stye) or outside the eyelid (external stye). A stye can cause redness, swelling, and pain on the eyelid. Styes are very common. Anyone can get them at any age. They usually occur in just one eye at a time, but you may have more than one in either eye. What are the causes? A stye is caused by an infection. The infection is almost always caused by bacteria called Staphylococcus aureus. This is a common type of bacteria that lives on the skin. An internal stye may result from an infected oil-producing gland inside the eyelid. An external stye may be caused by an infection at the base of the eyelash (hair follicle). What increases the risk? You are more likely to develop a stye if: You have had a stye before. You have any of these conditions: Red, itchy, inflamed eyelids (blepharitis). A skin condition such as seborrheic dermatitis or rosacea. High fat levels in your blood (lipids). Dry eyes. What are the signs or symptoms? The most common symptom of a stye is eyelid pain. Internal styes are more painful than external styes. Other symptoms may include: Painful swelling of your eyelid. A scratchy feeling in your eye. Tearing and redness of your eye. A pimple-like bump on the edge of the eyelid. Pus draining from the stye. How is this diagnosed? Your health care provider may be able to diagnose a stye just by examining your eye. The health care provider may also check to make sure: You do not have a fever or other signs of a more serious infection. The infection has not spread to other parts of your eye or areas around your eye. How is this treated? Most styes will clear up in a few days without treatment or with warm compresses applied to the area. You may need to use antibiotic drops or ointment to treat an infection. Sometimes,  steroid drops or ointment are used in addition to antibiotics. In some cases, your health care provider may give you a small steroid injection in the eyelid. If your stye does not heal with routine treatment, your health care provider may drain pus from the stye using a thin blade or needle. This may be done if the stye is large, causing a lot of pain, or affecting your vision. Follow these instructions at home: Take over-the-counter and prescription medicines only as told by your health care provider. This includes eye drops or ointments. If you were prescribed an antibiotic medicine, steroid medicine, or both, apply or use them as told by your health care provider. Do not stop using the medicine even if your condition improves. Apply a warm, wet cloth (warm compress) to your eye for 5-10 minutes, 4 to 6 times a day. Clean the affected eyelid as directed by your health care provider. Do not wear contact lenses or eye makeup until your stye has healed and your health care provider says that it is safe. Do not try to pop or drain the stye. Do not rub your eye. Contact a health care provider if: You have chills or a fever. Your stye does not go away after several days. Your stye affects your vision. Your eyeball becomes swollen, red, or painful. Get help right away if: You have pain when moving your eye around. Summary A stye is a bump that forms   on an eyelid. It may look like a pimple next to the eyelash. A stye can form inside the eyelid (internal stye) or outside the eyelid (external stye). A stye can cause redness, swelling, and pain on the eyelid. Your health care provider may be able to diagnose a stye just by examining your eye. Apply a warm, wet cloth (warm compress) to your eye for 5-10 minutes, 4 to 6 times a day. This information is not intended to replace advice given to you by your health care provider. Make sure you discuss any questions you have with your health care  provider. Document Revised: 06/02/2020 Document Reviewed: 06/02/2020 Elsevier Patient Education  2024 [employer]  

## 2023-04-27 ENCOUNTER — Ambulatory Visit: Payer: Managed Care, Other (non HMO) | Admitting: Student

## 2023-04-27 VITALS — BP 160/100 | HR 74 | Temp 97.7°F | Ht 62.0 in | Wt 132.0 lb

## 2023-04-27 DIAGNOSIS — R6889 Other general symptoms and signs: Secondary | ICD-10-CM | POA: Diagnosis not present

## 2023-04-27 DIAGNOSIS — I10 Essential (primary) hypertension: Secondary | ICD-10-CM | POA: Diagnosis not present

## 2023-04-27 DIAGNOSIS — F17200 Nicotine dependence, unspecified, uncomplicated: Secondary | ICD-10-CM | POA: Diagnosis not present

## 2023-04-27 MED ORDER — PROMETHAZINE HCL 12.5 MG PO TABS: 12.5000 mg | ORAL_TABLET | Freq: Three times a day (TID) | ORAL | 0 refills | Status: AC | PRN

## 2023-04-27 MED ORDER — NICOTINE 7 MG/24HR TD PT24: 7.0000 mg | MEDICATED_PATCH | Freq: Every day | TRANSDERMAL | 0 refills | Status: AC

## 2023-04-27 MED ORDER — LOPERAMIDE HCL 2 MG PO CAPS: 4.0000 mg | ORAL_CAPSULE | ORAL | 0 refills | Status: AC | PRN

## 2023-04-27 NOTE — Assessment & Plan Note (Signed)
Elevated x 2 today, although she is acutely ill. Will have her return when her symptoms have resolved for blood pressure recheck -Continue amlodipine 5 mg daily for now

## 2023-04-27 NOTE — Assessment & Plan Note (Signed)
Cough x 2 weeks with headache, body aches, diarrhea for the past week Likely secondary to viral illness.  I do not have any concern for acute bacterial infection today including pneumonia. Her SpO2 was 98% on room air, no focal lung findings. She is overall well-hydrated, nontoxic-appearing. Discussed symptomatic management with hydration, honey, rest. Discussed that she should avoid combination cold and flu medications, and Sudafed as they can elevate her blood pressure. Prescribed promethazine every 8 hours for cough, discussed this may make her sleepy Also prescribed loperamide for diarrhea Return precautions discussed should symptoms persist or worsen in the next 4 to 5 days

## 2023-04-27 NOTE — Patient Instructions (Addendum)
It was great seeing you today.  You likely have a viral illness.  As we discussed, - Hydrate as best as you can with water, hot tea, soup, etc - Avoid combined cold/flu medicines as they can increase your blood pressure - You can take honey or promethazine every 8 hours as needed for cough - You can take Immodium (loperamide) for diarrhea as needed - Tylenol and ibuprofen for headaches and body aches  - Nasal saline mist/spray is helpful for nasal irritation   If you have any questions or concerns, please feel free to call the clinic.   Have a wonderful day,  Dr. Darral Dash [facility] [phone]

## 2023-04-27 NOTE — Progress Notes (Signed)
    SUBJECTIVE:   CHIEF COMPLAINT / HPI:   Julia May is a 43 year old female with a history of tobacco use and hypertension here with sick symptoms.  Flulike symptoms Cough started 2 weeks ago. Then, developed diarrhea for 1 week intermittently. Today, has diarrhea, a headache, nasal congestion and body aches.  Has had 2 negative COVD/flu tests at home. Able to keep food and fluid down without difficulty No dyspnea on exertion She smokes cigarettes, 1/2 PPD Taking Theraflu, cold/flu tablets, Sudafed. No sick contacts or travel  Hypertension Currently taking amlodipine 5 mg daily, which she has not taken for the past 2 days. Prior to current episode of illness, her pressures are well-controlled in the 120s over 80s and chart review. Denies any chest pain, shortness of breath, visual changes.   OBJECTIVE:   BP (!) 160/100   Pulse 74   Temp 97.7 F (36.5 C)   Ht 5\' 2"  (1.575 m)   Wt 132 lb (59.9 kg)   SpO2 98%   BMI 24.14 kg/m   General: NAD, tired appearing, but nontoxic, wears a mask HEENT: Nasal congestion present.  Boggy nasal turbinates.  No oropharyngeal erythema.  No tonsillar hypertrophy or exudate.  Mildly tacky mucous membranes. Cardiac: RRR Neuro: A&O Respiratory: Speaks in full sentences without difficulty normal WOB on RA. No wheezing or crackles on auscultation, good lung sounds throughout Extremities: Moving all 4 extremities equally Skin: Warm and dry ASSESSMENT/PLAN:   Flu-like symptoms Cough x 2 weeks with headache, body aches, diarrhea for the past week Likely secondary to viral illness.  I do not have any concern for acute bacterial infection today including pneumonia. Her SpO2 was 98% on room air, no focal lung findings. She is overall well-hydrated, nontoxic-appearing. Discussed symptomatic management with hydration, honey, rest. Discussed that she should avoid combination cold and flu medications, and Sudafed as they can elevate her blood  pressure. Prescribed promethazine every 8 hours for cough, discussed this may make her sleepy Also prescribed loperamide for diarrhea Return precautions discussed should symptoms persist or worsen in the next 4 to 5 days   HTN (hypertension) Elevated x 2 today, although she is acutely ill. Will have her return when her symptoms have resolved for blood pressure recheck -Continue amlodipine 5 mg daily for now  TOBACCO DEPENDENCE Encourage patient to continue to avoid smoking as she is. Nicotine patches sent to pharmacy to help her with continued cessation   Follow-up: 2 weeks for BP re-check. If still having persistent systemic symptoms (malaise, headache, body aches, etc.) may consider lab work (CBC. HIV, etc)  Darral Dash, DO [facility]

## 2023-04-27 NOTE — Assessment & Plan Note (Addendum)
Encourage patient to continue to avoid smoking as she is. Nicotine patches sent to pharmacy to help her with continued cessation

## 2023-05-14 ENCOUNTER — Other Ambulatory Visit: Payer: Self-pay

## 2023-07-16 ENCOUNTER — Telehealth: Payer: Self-pay

## 2023-07-16 NOTE — Telephone Encounter (Signed)
 Patient LVM on nurse line requesting a refill on Triamcinolone Cream.   She reports on VM "it has been a while" since I have used this medication.   Attempted to call patient back to gather more information and need for cream. However, I had to LVM.   Will forward to PCP.

## 2023-07-16 NOTE — Telephone Encounter (Signed)
 Left the patient a vm informing them to give [place] a call back so we can schedule them for an office visit.

## 2023-07-16 NOTE — Telephone Encounter (Signed)
 Patient returns call to nurse line. She reports that she was prescribed triamcinolone for eczema on neck.   I was unable to find this per chart review.   Advised that I would send message to provider, however, she may need to schedule an appointment prior to this being prescribed.   Patient voices understanding.   Veronda Prude, RN

## 2023-07-17 ENCOUNTER — Ambulatory Visit (INDEPENDENT_AMBULATORY_CARE_PROVIDER_SITE_OTHER): Admitting: Student

## 2023-07-17 VITALS — BP 156/97 | HR 79 | Ht 62.0 in | Wt 135.2 lb

## 2023-07-17 DIAGNOSIS — I10 Essential (primary) hypertension: Secondary | ICD-10-CM

## 2023-07-17 DIAGNOSIS — J302 Other seasonal allergic rhinitis: Secondary | ICD-10-CM | POA: Insufficient documentation

## 2023-07-17 MED ORDER — TRIAMCINOLONE ACETONIDE 0.1 % EX CREA
1.0000 | TOPICAL_CREAM | Freq: Two times a day (BID) | CUTANEOUS | 0 refills | Status: AC
Start: 2023-07-17 — End: ?

## 2023-07-17 MED ORDER — CETIRIZINE HCL 10 MG PO TABS
10.0000 mg | ORAL_TABLET | Freq: Every day | ORAL | 1 refills | Status: AC
Start: 2023-07-17 — End: ?

## 2023-07-17 MED ORDER — AMLODIPINE BESYLATE 5 MG PO TABS
5.0000 mg | ORAL_TABLET | Freq: Every day | ORAL | 1 refills | Status: AC
Start: 2023-07-17 — End: ?

## 2023-07-17 NOTE — Patient Instructions (Addendum)
 Pleasure to see you today.  Suspect the rashes and the swelling in the eye and redness is simply an allergic reaction.  I have sent in prescription for Zyrtec which you will take daily and Pepcid.  You can get over-the-counter Goldbond itch by over the itchy areas.  Your blood pressure today was elevated 156/97.  Today I have refilled your medication.  Please make sure you are taking your medication as prescribed.  I recommend checking your blood pressure daily, keep a blood pressure log and follow-up with your PCP in 2 weeks so that she can reassess blood pressure and to determine if you need to increase your medication dose.

## 2023-07-17 NOTE — Assessment & Plan Note (Signed)
 Patient's presentation of diffuse rash, periorbital edema, conjunctivitis is concerning for allergic rhinitis given peak season for pollen.  -Refilled Zyrtec's 10 mg daily, Rx triamcinolone cream -Recommend use of OTC Goldbond anti-itch for pruritus  -Patient to return if symptoms does not improve.  -ED precaution discussed with

## 2023-07-17 NOTE — Progress Notes (Signed)
    SUBJECTIVE:   CHIEF COMPLAINT / HPI:   43 year old female with history of hypertension and allergic rhinitis presenting today for blood pressure follow-up.  Reports she has been out of her blood pressure medication for about 2 months previously on amlodipine 5 mg daily.  Reports occasional hypertension when she checks blood pressures at home and endorses symptoms of headache and visual floaters when blood pressures elevated.  Lately not having any headaches or visual field floaters.  Patient patient reports she started having watery eyes, runny nose, rash around her neck that is pruritic.  This started shortly after the grasses and how long we are caught recently.  Have been present for the last 4 days.  Denies any difficulty breathing.   PERTINENT  PMH / PSH: Reviewed  OBJECTIVE:   BP (!) 156/97   Pulse 79   Ht 5\' 2"  (1.575 m)   Wt 135 lb 3.2 oz (61.3 kg)   SpO2 100%   BMI 24.73 kg/m    Physical Exam General: Alert, well appearing, NAD HEENT: Bilateral conjunctivitis, MMM. Cardiovascular: RRR, No Murmurs, Normal S2/S2 Respiratory: CTAB, No wheezing or Rales Skin: Diffuse erythematous flat lesion around the neck.   ASSESSMENT/PLAN:   HTN (hypertension) Elevated BP x 2 today.  Suspect this is due to noncompliance. -Refilled patient's amlodipine - Encourage patient to check daily BP, and keep BP logs - Follow-up in 2 weeks to put with PCP to reassess BP readings and need for occasion adjustment if necessary.  Seasonal allergic rhinitis Patient's presentation of diffuse rash, periorbital edema, conjunctivitis is concerning for allergic rhinitis given peak season for pollen.  -Refilled Zyrtec's 10 mg daily, Rx triamcinolone cream -Recommend use of OTC Goldbond anti-itch for pruritus  -Patient to return if symptoms does not improve.  -ED precaution discussed with   Jerre Simon, MD [facility]

## 2023-07-17 NOTE — Assessment & Plan Note (Signed)
 Elevated BP x 2 today.  Suspect this is due to noncompliance. -Refilled patient's amlodipine - Encourage patient to check daily BP, and keep BP logs - Follow-up in 2 weeks to put with PCP to reassess BP readings and need for occasion adjustment if necessary.

## 2023-07-23 ENCOUNTER — Telehealth: Payer: Self-pay

## 2023-07-23 NOTE — Telephone Encounter (Signed)
 Patient calls nurse line requesting appointment for follow up/EKG.   She reports that left shoulder is aching. She denies radiation of pain into her arm. Denies chest pain, shortness of breath, jaw pain, nausea or vomiting.   She also reports seeing floaters since last Thursday.   She states that she works at a [employer]. They advised that she should be seen at PCP office and get an EKG. They did not feel that she needed to be seen in the ED today.   Patient scheduled for evaluation tomorrow morning with Dr. Rumball. ED precautions discussed. Patient verbalizes understanding.   Elsie Halo, RN

## 2023-07-24 ENCOUNTER — Ambulatory Visit
Admission: RE | Admit: 2023-07-24 | Discharge: 2023-07-24 | Disposition: A | Discharge status: Home / Self Care | Source: Ambulatory Visit | Attending: Family Medicine | Admitting: Family Medicine

## 2023-07-24 ENCOUNTER — Ambulatory Visit (INDEPENDENT_AMBULATORY_CARE_PROVIDER_SITE_OTHER): Admitting: Family Medicine

## 2023-07-24 ENCOUNTER — Encounter: Payer: Self-pay | Admitting: Family Medicine

## 2023-07-24 VITALS — BP 149/112 | HR 98 | Ht 62.0 in | Wt 130.0 lb

## 2023-07-24 DIAGNOSIS — A084 Viral intestinal infection, unspecified: Secondary | ICD-10-CM | POA: Diagnosis not present

## 2023-07-24 DIAGNOSIS — M542 Cervicalgia: Secondary | ICD-10-CM | POA: Diagnosis not present

## 2023-07-24 DIAGNOSIS — M25519 Pain in unspecified shoulder: Secondary | ICD-10-CM | POA: Insufficient documentation

## 2023-07-24 DIAGNOSIS — M25512 Pain in left shoulder: Secondary | ICD-10-CM | POA: Diagnosis not present

## 2023-07-24 DIAGNOSIS — I10 Essential (primary) hypertension: Secondary | ICD-10-CM

## 2023-07-24 MED ORDER — MELOXICAM 15 MG PO TABS: 15.0000 mg | ORAL_TABLET | Freq: Every day | ORAL | 0 refills | Status: AC

## 2023-07-24 NOTE — Progress Notes (Signed)
    SUBJECTIVE:   CHIEF COMPLAINT / HPI:   SHOULDER PAIN - L shoulder pain/aching constant since Thursday. Denies new activities or known injury. Shoulder pain radiates down to elbow and notes tingles a little. Also notes some headache and neck pain - is L handed. Feels grip is a little weaker - has taken tylenol, heat wihtout relief. - denies numbness, redness, swelling, fevers - additionally had nausea, few episodes of vomiting and diarrhea on Sunday but this is improved.  - no FH of early heart dz.  - on amlodipine (takes 2 tablets) for HTN. Took meds this morning just before coming. Did not take yesterday. - has IUD in place. Does not have periods.    OBJECTIVE:   BP (!) 149/112   Pulse 98   Ht 5\' 2"  (1.575 m)   Wt 130 lb (59 kg)   SpO2 100%   BMI 23.78 kg/m   Gen: well appearing, in NAD Card: RRR Lungs: CTAB R shoulder: symmetric, no obvious deformities. 5/5 UE strength bilaterally including grip strength. Intact finger abduction/adduction. Negative speeds, Hawkins, empty can. +Spurlings. Radial pulses intact. 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 Will obtain cervical XR, meloxicam for pain. Extremely low likelihood of cardiac pathology, fracture.   Viral gastroenteritis ~24 hours of N/V/D, now improved. Continue supportive care.   HTN (hypertension) Elevated today but prior normals and normal home readings when takes antihypertensives. Recommend regular compliance. F/u with PCP.     Kandis Ormond, DO

## 2023-07-24 NOTE — Assessment & Plan Note (Signed)
 Elevated today but prior normals and normal home readings when takes antihypertensives. Recommend regular compliance. F/u with PCP.

## 2023-07-24 NOTE — Assessment & Plan Note (Signed)
~  24 hours of N/V/D, now improved. Continue supportive care.

## 2023-07-24 NOTE — Assessment & Plan Note (Addendum)
 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 Will obtain cervical XR, meloxicam for pain. Extremely low likelihood of cardiac pathology, fracture.

## 2023-07-24 NOTE — Patient Instructions (Signed)
 It was great to see you!  Our plans for today:  - We are getting an xray of your neck. We will release these results to your MyChart. - Take meloxicam as needed for pain.  - If your pain is no better after about a week of taking the medication or if your symptoms worsen, come back to see us .   Take care and seek immediate care sooner if you develop any concerns.   Dr. Yanice Maqueda

## 2023-08-08 ENCOUNTER — Encounter: Payer: Self-pay | Admitting: Family Medicine

## 2023-09-20 ENCOUNTER — Ambulatory Visit: Admitting: Student

## 2023-09-20 VITALS — BP 118/70 | HR 85 | Wt 135.4 lb

## 2023-09-20 DIAGNOSIS — H00015 Hordeolum externum left lower eyelid: Secondary | ICD-10-CM

## 2023-09-20 MED ORDER — AMOXICILLIN-POT CLAVULANATE 875-125 MG PO TABS
1.0000 | ORAL_TABLET | Freq: Two times a day (BID) | ORAL | 0 refills | Status: AC
Start: 2023-09-20 — End: 2023-09-27

## 2023-09-20 NOTE — Assessment & Plan Note (Addendum)
 No systemic sxs or vision changes. No known MRSA risk factors -Warm Compresses, apply for 10-15 minutes, 3-5 times a day  -Lid Hygiene: Clean the eyelid margin with diluted baby shampoo or commercial lid wipes  -Avoid eye makeup and contact lenses during treatment  -Augmentin  twice a day for 7 days -Follow up with ophthalmology, I placed urgent referral -ED/return precautions discussed

## 2023-09-20 NOTE — Progress Notes (Signed)
    SUBJECTIVE:   CHIEF COMPLAINT / HPI: Stye  Discussed the use of AI scribe software for clinical note transcription with the patient, who gave verbal consent to proceed.  History of Present Illness Julia May is a 43 year old female who presents with a persistent stye on her eye.  The stye has been present for a couple of weeks, fluctuating in size, and is currently increasing. She has used an antibiotic eye cream without relief. Irritation and pain occur with blinking but no pain with EOM. The stye drains a watery fluid, not pus or blood. She does not use makeup or contact lenses and has no known allergies to antibiotics. There are no vision changes, worsening swelling, or systemic symptoms such as fevers or malaise.  PERTINENT  PMH / PSH: reviewed  OBJECTIVE:   BP 118/70   Pulse 85   Wt 135 lb 6.4 oz (61.4 kg)   SpO2 95%   BMI 24.76 kg/m   General: Well appearing, NAD, awake, alert, responsive to questions Head: Normocephalic atraumatic, conjunctivae clear b/l -stye present on lower lid left eye, EOMI, pupilse equal and reactive Respiratory: chest rises symmetrically, no increased work of breathing Extremities: Moves upper and lower extremities freely    ASSESSMENT/PLAN:   Assessment & Plan Hordeolum externum of left lower eyelid No systemic sxs or vision changes. No known MRSA risk factors -Warm Compresses, apply for 10-15 minutes, 3-5 times a day  -Lid Hygiene: Clean the eyelid margin with diluted baby shampoo or commercial lid wipes  -Avoid eye makeup and contact lenses during treatment  -Augmentin  twice a day for 7 days -Follow up with ophthalmology, I placed urgent referral -ED/return precautions discussed   Genora Kidd, MD [facility]

## 2023-09-20 NOTE — Patient Instructions (Addendum)
 It was great to see you! Thank you for allowing me to participate in your care!   Our plans for today:  Warm Compresses: Apply for 10-15 minutes, 3-5 times a day - This will help with drainage  Lid Hygiene: Clean the eyelid margin with diluted baby shampoo or commercial lid wipes  Avoid eye makeup and contact lenses during treatment  Augmentin  twice a day for 7 days Follow up with ophthalmology, I am placing urgent referral Return to care immediately if vision changes, fevers, worsening eye pain/swelling  Take care and seek immediate care sooner if you develop any concerns.  Genora Kidd, MD

## 2023-09-21 ENCOUNTER — Other Ambulatory Visit: Payer: Self-pay | Admitting: Family Medicine

## 2023-10-19 ENCOUNTER — Telehealth: Payer: Self-pay

## 2023-10-19 NOTE — Telephone Encounter (Signed)
 Patient LVM on nurse line regarding paperwork.   No other information provided.   Attempted to call patient back to gather more information, patient did not answer. I was unable to leave a voice message. Sent patient mychart message.   Chiquita JAYSON English, RN

## 2023-12-14 ENCOUNTER — Encounter: Payer: Self-pay | Admitting: Family Medicine

## 2023-12-17 ENCOUNTER — Encounter: Payer: Self-pay | Admitting: Family Medicine

## 2023-12-17 ENCOUNTER — Ambulatory Visit: Admitting: Family Medicine

## 2023-12-17 VITALS — BP 138/93 | HR 96 | Wt 129.2 lb

## 2023-12-17 DIAGNOSIS — R160 Hepatomegaly, not elsewhere classified: Secondary | ICD-10-CM | POA: Diagnosis not present

## 2023-12-17 DIAGNOSIS — F339 Major depressive disorder, recurrent, unspecified: Secondary | ICD-10-CM

## 2023-12-17 DIAGNOSIS — I10 Essential (primary) hypertension: Secondary | ICD-10-CM

## 2023-12-17 MED ORDER — ESCITALOPRAM OXALATE 20 MG PO TABS: 20.0000 mg | ORAL_TABLET | Freq: Every day | ORAL | 1 refills | Status: AC

## 2023-12-17 NOTE — Patient Instructions (Signed)
 Pap Test: What to Know Why am I having this test? A Pap test, also called a Pap smear, is a screening test to check for signs of: Infection. Cancer of the cervix. The cervix is the lowest part of the uterus. Precancerous changes. These are changes that may be a sign that cancer is developing. Females need this test regularly. In general, you should have a Pap test every 3 years until you reach menopause or you are 42 years old. If you are 59-26 years old you may choose to have their Pap test done at the same time as an human papillomavirus (HPV) test every 5 years instead of every 3 years. Your health care provider may recommend having Pap tests more or less often depending on your medical conditions and past Pap test results. What is being tested? Cervical cells are tested for signs of infection or abnormalities. What kind of sample is taken?  Your provider will collect a sample of cells from the surface of your cervix. This will be done using a small cotton swab, plastic spatula, or brush that is inserted into your vagina using a tool called a speculum. This sample is often collected during a pelvic exam, when you are lying on your back on an exam table with your feet in footrests, called stirrups. In some cases, fluids (secretions) from the cervix or vagina may also be collected. How do I prepare for this test? Know where you are in your menstrual cycle. If you're menstruating on the day of the test, you may be asked to reschedule. You may need to reschedule if you have a known vaginal infection on the day of the test. Follow instructions from your provider about: Changing or stopping your regular medicines. Some medicines, such as vaginal medicines and tetracycline, can cause abnormal test results. Avoiding douching 2-3 days before or the day of the test. Tell a health care provider about: Any allergies you have. All medicines you take. These include vitamins, herbs, eye drops, and  creams. Any bleeding problems you have. Any surgeries you've had. Any medical problems you have. Whether you're pregnant or may be pregnant. How are the results reported? Your test results will be reported as either abnormal or normal. What do the results mean? A normal test result means that you do not have signs of cancer of the cervix. An abnormal result may mean that you have: Cancer. A Pap test by itself is not enough to diagnose cancer. You will have more tests done if cancer is suspected. Precancerous changes in your cervix. Inflammation of the cervix. A sexually transmitted infection (STI). A fungal infection. An infection from a parasite. Talk with your provider about what your results mean. More tests may be needed. Questions to ask your health care provider Ask your provider, or the department that is doing the test: When will my results be ready? How will I get my results? What are my treatment options? What other tests do I need? What are my next steps? This information is not intended to replace advice given to you by your health care provider. Make sure you discuss any questions you have with your health care provider. Document Revised: 06/16/2023 Document Reviewed: 06/16/2023 Elsevier Patient Education  2025 [employer].

## 2023-12-17 NOTE — Assessment & Plan Note (Signed)
 Depression poorly controlled on Lexapro  10 mg with persistent insomnia despite trazodone  50 mg. - Increase Lexapro  to 20 mg daily. Use two 10 mg tablets if available. - Add melatonin 30 minutes before sleep with trazodone . Start 5 mg, increase to 10 mg if needed, max 10 mg without consultation - Trazodone  prn insomnia at 50 mg at bedtime prn

## 2023-12-17 NOTE — Progress Notes (Signed)
 SUBJECTIVE:   CHIEF COMPLAINT / HPI:  Discussed the use of AI scribe software for clinical note transcription with the patient, who gave verbal consent to proceed.  History of Present Illness   Julia May is a 43 year old female with insomnia and depression who presents with persistent sleep disturbances and mood changes.  She experiences persistent insomnia, characterized by difficulty staying asleep, waking up every three hours, and feeling tired during the day. She has been using trazodone  50 mg as needed at bedtime but finds it ineffective, leading her to double the dose occasionally. She has also tried melatonin and THC products in the past, which help her fall asleep but not stay asleep. However, she has not tried Melatonin recently.   She experiences mood changes, feeling more irritable and agitated, with 'every little thing' irritating her. She is currently on Lexapro  10 mg daily for depression, but feels it is not sufficiently effective, as her mood remains affected. She requested intermittent LOA form completion to attend her appointments and get treatment when she has a flare.  She was seen in the ER on Thursday for severe stomach pain, described as 'horrible' and preventing her from standing. An X-ray was performed, which revealed hepatomegaly, and she received a shot for nausea. The pain has since subsided but occasionally returns.  Her current medications include albuterol  as needed, amlodipine  5 mg daily, Zyrtec  as needed, Flexeril  as needed, hydroxyzine  as needed, ibuprofen  as needed, Nasonex  nasal spray as needed, Phenergan  as needed, Kenalog  cream, and Biothrax. She discontinued the nicotine  patch and has not picked it up.       PERTINENT  PMH / PSH: pmhx reviewed  OBJECTIVE:   BP (!) 138/93   Pulse 96   Wt 129 lb 3.2 oz (58.6 kg)   SpO2 100%   BMI 23.63 kg/m   Physical Exam   VITALS: BP- 135/105 CHEST: Lungs clear to auscultation bilaterally, no wheezing or  rales. CARDIOVASCULAR: Heart sounds good, S1 and S2, no murmur. RRR ABDOMEN: Abdomen with good bowel sounds, no distention, no tenderness. Liver and spleen not palpable. EXTREMITIES: No leg swelling.     Results   RADIOLOGY Abdominal X-ray: shows possible hepatomegaly (12/13/2023)       ASSESSMENT/PLAN:   Assessment & Plan   Assessment and Plan    Depression with insomnia Depression poorly controlled on Lexapro  10 mg with persistent insomnia despite trazodone  50 mg. - Increase Lexapro  to 20 mg daily. Use two 10 mg tablets if available. - Add melatonin 30 minutes before sleep with trazodone . Start 5 mg, increase to 10 mg if needed, max 10 mg without consultation. Take Trazodone  50 mg at bedtime prn insomnia and not 100 mg - She agreed with the plan  Suspected hepatomegaly with a history of resolved abdominal pain Hepatomegaly suspected; abdominal pain resolved. Possible Hepatomegaly on X-ray, ultrasound recommended for liver evaluation. - Per patient, RUQ US  was already ordered by the [facility] provider and is waiting for an appointment. - Perform blood tests for liver enzymes and hepatitis B status. - She was advised to call me soon if she does hear back from the [facility] department to schedule her RUQ US , then I will place an order.  Essential hypertension Blood pressure improved from 135/105 to 138/93 on amlodipine  5 mg. - Continue amlodipine  5 mg daily. - Consider dose increase if persistent diastolic BP elevation - She agreed with the plan  General Health Maintenance Due for COVID, flu, pneumonia vaccinations, and a Pap  smear. - Schedule COVID and flu vaccinations at the preferred center. - Offer pneumonia vaccination due to asthma. - Schedule Pap smear in 2-3 weeks. Offered to schedule now, but she wants to check her schedule.  Follow-Up Follow-up required for lab results and ultrasound scheduling. - Instruct to call if an ultrasound appointment is not scheduled for ordering and  scheduling.       She completed her own portion of her work LOA form with [employer] approved. Work LOA form completed and placed in the front office to be faxed.   Otto Fairly, MD [facility]

## 2023-12-18 ENCOUNTER — Other Ambulatory Visit: Payer: Self-pay | Admitting: Family Medicine

## 2023-12-18 ENCOUNTER — Ambulatory Visit: Payer: Self-pay | Admitting: Family Medicine

## 2023-12-18 LAB — CMP14+EGFR
ALT: 133 IU/L — ABNORMAL HIGH (ref 0–32)
AST: 157 IU/L — ABNORMAL HIGH (ref 0–40)
Albumin: 4.4 g/dL (ref 3.9–4.9)
Alkaline Phosphatase: 90 IU/L (ref 44–121)
BUN/Creatinine Ratio: 15 (ref 9–23)
BUN: 12 mg/dL (ref 6–24)
Bilirubin Total: 0.5 mg/dL (ref 0.0–1.2)
CO2: 24 mmol/L (ref 20–29)
Calcium: 9.6 mg/dL (ref 8.7–10.2)
Chloride: 93 mmol/L — ABNORMAL LOW (ref 96–106)
Creatinine, Ser: 0.82 mg/dL (ref 0.57–1.00)
Globulin, Total: 2.8 g/dL (ref 1.5–4.5)
Glucose: 93 mg/dL (ref 70–99)
Potassium: 3.1 mmol/L — ABNORMAL LOW (ref 3.5–5.2)
Sodium: 137 mmol/L (ref 134–144)
Total Protein: 7.2 g/dL (ref 6.0–8.5)
eGFR: 92 mL/min/1.73 (ref >59)

## 2023-12-18 LAB — ACUTE VIRAL HEPATITIS (HAV, HBV, HCV)
HCV Ab: NONREACTIVE
Hep A IgM: NEGATIVE
Hep B C IgM: NEGATIVE
Hepatitis B Surface Ag: NEGATIVE

## 2023-12-18 LAB — LIPID PANEL
Chol/HDL Ratio: 2.3 ratio (ref 0.0–4.4)
Cholesterol, Total: 161 mg/dL (ref 100–199)
HDL: 69 mg/dL (ref >39)
LDL Chol Calc (NIH): 79 mg/dL (ref 0–99)
Triglycerides: 65 mg/dL (ref 0–149)
VLDL Cholesterol Cal: 13 mg/dL (ref 5–40)

## 2023-12-18 LAB — HCV INTERPRETATION

## 2023-12-18 LAB — HEPATITIS B SURFACE ANTIBODY, QUANTITATIVE: Hepatitis B Surf Ab Quant: 77.1 m[IU]/mL

## 2023-12-18 MED ORDER — POTASSIUM CHLORIDE CRYS ER 10 MEQ PO TBCR: 10.0000 meq | EXTENDED_RELEASE_TABLET | Freq: Every day | ORAL | 0 refills | Status: AC

## 2023-12-25 ENCOUNTER — Telehealth: Payer: Self-pay

## 2023-12-25 NOTE — Telephone Encounter (Signed)
 Patient calls nurse line in regards to FMLA forms.   She reports she gave these to PCP during last office visit.   She reports she would like to check the status of these.  Advised will forward to PCP.

## 2023-12-25 NOTE — Telephone Encounter (Signed)
 Form was faxed on 9/10 per front office. Patient reports her employer has not received this as of yet.   Form pulled and refaxed to employer.   Copy made for batch scanning.   Copy placed up front for pick up.   Patient has been updated.
